# Patient Record
Sex: Female | Born: 1995 | Race: White | Hispanic: No | Marital: Single | Smoking: Never smoker
Health system: Southern US, Community
[De-identification: ages and names within clinical notes are randomized; demographics above are authoritative.]

## PROBLEM LIST (undated history)

## (undated) DIAGNOSIS — J353 Hypertrophy of tonsils with hypertrophy of adenoids: Secondary | ICD-10-CM

## (undated) DIAGNOSIS — G540 Brachial plexus disorders: Secondary | ICD-10-CM

## (undated) DIAGNOSIS — Z9889 Other specified postprocedural states: Secondary | ICD-10-CM

## (undated) DIAGNOSIS — D649 Anemia, unspecified: Secondary | ICD-10-CM

## (undated) DIAGNOSIS — R112 Nausea with vomiting, unspecified: Secondary | ICD-10-CM

## (undated) HISTORY — PX: RIB RESECTION: SHX5077

## (undated) HISTORY — PX: WISDOM TOOTH EXTRACTION: SHX21

## (undated) HISTORY — DX: Anemia, unspecified: D64.9

---
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
## 2017-01-07 DIAGNOSIS — M542 Cervicalgia: Secondary | ICD-10-CM | POA: Diagnosis not present

## 2017-01-07 DIAGNOSIS — M5414 Radiculopathy, thoracic region: Secondary | ICD-10-CM | POA: Diagnosis not present

## 2017-01-07 DIAGNOSIS — M546 Pain in thoracic spine: Secondary | ICD-10-CM | POA: Diagnosis not present

## 2017-01-14 DIAGNOSIS — M9903 Segmental and somatic dysfunction of lumbar region: Secondary | ICD-10-CM | POA: Diagnosis not present

## 2017-01-14 DIAGNOSIS — M9902 Segmental and somatic dysfunction of thoracic region: Secondary | ICD-10-CM | POA: Diagnosis not present

## 2017-01-14 DIAGNOSIS — G44209 Tension-type headache, unspecified, not intractable: Secondary | ICD-10-CM | POA: Diagnosis not present

## 2017-01-14 DIAGNOSIS — F4322 Adjustment disorder with anxiety: Secondary | ICD-10-CM | POA: Diagnosis not present

## 2017-01-14 DIAGNOSIS — M9901 Segmental and somatic dysfunction of cervical region: Secondary | ICD-10-CM | POA: Diagnosis not present

## 2017-01-19 DIAGNOSIS — M542 Cervicalgia: Secondary | ICD-10-CM | POA: Diagnosis not present

## 2017-01-19 DIAGNOSIS — M546 Pain in thoracic spine: Secondary | ICD-10-CM | POA: Diagnosis not present

## 2017-01-19 DIAGNOSIS — M5414 Radiculopathy, thoracic region: Secondary | ICD-10-CM | POA: Diagnosis not present

## 2017-01-28 DIAGNOSIS — M546 Pain in thoracic spine: Secondary | ICD-10-CM | POA: Diagnosis not present

## 2017-01-28 DIAGNOSIS — M5414 Radiculopathy, thoracic region: Secondary | ICD-10-CM | POA: Diagnosis not present

## 2017-01-28 DIAGNOSIS — M542 Cervicalgia: Secondary | ICD-10-CM | POA: Diagnosis not present

## 2017-02-18 DIAGNOSIS — Z6824 Body mass index (BMI) 24.0-24.9, adult: Secondary | ICD-10-CM | POA: Diagnosis not present

## 2017-02-18 DIAGNOSIS — Z01419 Encounter for gynecological examination (general) (routine) without abnormal findings: Secondary | ICD-10-CM | POA: Diagnosis not present

## 2017-02-18 DIAGNOSIS — Z113 Encounter for screening for infections with a predominantly sexual mode of transmission: Secondary | ICD-10-CM | POA: Diagnosis not present

## 2017-02-25 DIAGNOSIS — F4322 Adjustment disorder with anxiety: Secondary | ICD-10-CM | POA: Diagnosis not present

## 2017-03-26 DIAGNOSIS — M542 Cervicalgia: Secondary | ICD-10-CM | POA: Diagnosis not present

## 2017-03-26 DIAGNOSIS — M5414 Radiculopathy, thoracic region: Secondary | ICD-10-CM | POA: Diagnosis not present

## 2017-03-26 DIAGNOSIS — M546 Pain in thoracic spine: Secondary | ICD-10-CM | POA: Diagnosis not present

## 2017-04-09 DIAGNOSIS — M5414 Radiculopathy, thoracic region: Secondary | ICD-10-CM | POA: Diagnosis not present

## 2017-04-09 DIAGNOSIS — M542 Cervicalgia: Secondary | ICD-10-CM | POA: Diagnosis not present

## 2017-04-09 DIAGNOSIS — M546 Pain in thoracic spine: Secondary | ICD-10-CM | POA: Diagnosis not present

## 2017-04-22 DIAGNOSIS — M5414 Radiculopathy, thoracic region: Secondary | ICD-10-CM | POA: Diagnosis not present

## 2017-04-22 DIAGNOSIS — M542 Cervicalgia: Secondary | ICD-10-CM | POA: Diagnosis not present

## 2017-04-22 DIAGNOSIS — M546 Pain in thoracic spine: Secondary | ICD-10-CM | POA: Diagnosis not present

## 2017-04-27 DIAGNOSIS — J019 Acute sinusitis, unspecified: Secondary | ICD-10-CM | POA: Diagnosis not present

## 2017-04-27 DIAGNOSIS — J209 Acute bronchitis, unspecified: Secondary | ICD-10-CM | POA: Diagnosis not present

## 2017-04-28 DIAGNOSIS — F4322 Adjustment disorder with anxiety: Secondary | ICD-10-CM | POA: Diagnosis not present

## 2017-05-06 DIAGNOSIS — M546 Pain in thoracic spine: Secondary | ICD-10-CM | POA: Diagnosis not present

## 2017-05-06 DIAGNOSIS — M542 Cervicalgia: Secondary | ICD-10-CM | POA: Diagnosis not present

## 2017-05-06 DIAGNOSIS — M5414 Radiculopathy, thoracic region: Secondary | ICD-10-CM | POA: Diagnosis not present

## 2017-05-18 DIAGNOSIS — M5414 Radiculopathy, thoracic region: Secondary | ICD-10-CM | POA: Diagnosis not present

## 2017-05-18 DIAGNOSIS — M542 Cervicalgia: Secondary | ICD-10-CM | POA: Diagnosis not present

## 2017-05-18 DIAGNOSIS — M546 Pain in thoracic spine: Secondary | ICD-10-CM | POA: Diagnosis not present

## 2017-06-01 DIAGNOSIS — M546 Pain in thoracic spine: Secondary | ICD-10-CM | POA: Diagnosis not present

## 2017-06-01 DIAGNOSIS — M542 Cervicalgia: Secondary | ICD-10-CM | POA: Diagnosis not present

## 2017-06-01 DIAGNOSIS — M5414 Radiculopathy, thoracic region: Secondary | ICD-10-CM | POA: Diagnosis not present

## 2017-06-17 DIAGNOSIS — M542 Cervicalgia: Secondary | ICD-10-CM | POA: Diagnosis not present

## 2017-06-17 DIAGNOSIS — M546 Pain in thoracic spine: Secondary | ICD-10-CM | POA: Diagnosis not present

## 2017-06-17 DIAGNOSIS — M5414 Radiculopathy, thoracic region: Secondary | ICD-10-CM | POA: Diagnosis not present

## 2017-06-30 DIAGNOSIS — J0100 Acute maxillary sinusitis, unspecified: Secondary | ICD-10-CM | POA: Diagnosis not present

## 2017-07-16 DIAGNOSIS — F4322 Adjustment disorder with anxiety: Secondary | ICD-10-CM | POA: Diagnosis not present

## 2017-07-29 DIAGNOSIS — J019 Acute sinusitis, unspecified: Secondary | ICD-10-CM | POA: Diagnosis not present

## 2017-08-13 DIAGNOSIS — F4322 Adjustment disorder with anxiety: Secondary | ICD-10-CM | POA: Diagnosis not present

## 2017-08-18 DIAGNOSIS — F4322 Adjustment disorder with anxiety: Secondary | ICD-10-CM | POA: Diagnosis not present

## 2017-08-24 DIAGNOSIS — M9901 Segmental and somatic dysfunction of cervical region: Secondary | ICD-10-CM | POA: Diagnosis not present

## 2017-08-24 DIAGNOSIS — M9903 Segmental and somatic dysfunction of lumbar region: Secondary | ICD-10-CM | POA: Diagnosis not present

## 2017-08-25 DIAGNOSIS — F4322 Adjustment disorder with anxiety: Secondary | ICD-10-CM | POA: Diagnosis not present

## 2017-09-02 DIAGNOSIS — M9901 Segmental and somatic dysfunction of cervical region: Secondary | ICD-10-CM | POA: Diagnosis not present

## 2017-09-08 DIAGNOSIS — J302 Other seasonal allergic rhinitis: Secondary | ICD-10-CM | POA: Diagnosis not present

## 2017-09-08 DIAGNOSIS — J329 Chronic sinusitis, unspecified: Secondary | ICD-10-CM | POA: Diagnosis not present

## 2017-09-08 DIAGNOSIS — E559 Vitamin D deficiency, unspecified: Secondary | ICD-10-CM | POA: Diagnosis not present

## 2017-09-08 DIAGNOSIS — K582 Mixed irritable bowel syndrome: Secondary | ICD-10-CM | POA: Diagnosis not present

## 2017-09-14 DIAGNOSIS — M9901 Segmental and somatic dysfunction of cervical region: Secondary | ICD-10-CM | POA: Diagnosis not present

## 2017-09-14 DIAGNOSIS — M9905 Segmental and somatic dysfunction of pelvic region: Secondary | ICD-10-CM | POA: Diagnosis not present

## 2017-09-14 DIAGNOSIS — M9902 Segmental and somatic dysfunction of thoracic region: Secondary | ICD-10-CM | POA: Diagnosis not present

## 2017-09-14 DIAGNOSIS — M9903 Segmental and somatic dysfunction of lumbar region: Secondary | ICD-10-CM | POA: Diagnosis not present

## 2017-09-16 DIAGNOSIS — F4322 Adjustment disorder with anxiety: Secondary | ICD-10-CM | POA: Diagnosis not present

## 2017-09-22 DIAGNOSIS — F4322 Adjustment disorder with anxiety: Secondary | ICD-10-CM | POA: Diagnosis not present

## 2017-09-24 DIAGNOSIS — M9903 Segmental and somatic dysfunction of lumbar region: Secondary | ICD-10-CM | POA: Diagnosis not present

## 2017-09-24 DIAGNOSIS — M9901 Segmental and somatic dysfunction of cervical region: Secondary | ICD-10-CM | POA: Diagnosis not present

## 2017-09-24 DIAGNOSIS — M9902 Segmental and somatic dysfunction of thoracic region: Secondary | ICD-10-CM | POA: Diagnosis not present

## 2017-09-28 DIAGNOSIS — J329 Chronic sinusitis, unspecified: Secondary | ICD-10-CM | POA: Diagnosis not present

## 2017-09-28 DIAGNOSIS — E559 Vitamin D deficiency, unspecified: Secondary | ICD-10-CM | POA: Diagnosis not present

## 2017-09-28 DIAGNOSIS — J302 Other seasonal allergic rhinitis: Secondary | ICD-10-CM | POA: Diagnosis not present

## 2017-09-28 DIAGNOSIS — K582 Mixed irritable bowel syndrome: Secondary | ICD-10-CM | POA: Diagnosis not present

## 2017-09-29 DIAGNOSIS — F4322 Adjustment disorder with anxiety: Secondary | ICD-10-CM | POA: Diagnosis not present

## 2017-09-30 DIAGNOSIS — M9903 Segmental and somatic dysfunction of lumbar region: Secondary | ICD-10-CM | POA: Diagnosis not present

## 2017-09-30 DIAGNOSIS — M9901 Segmental and somatic dysfunction of cervical region: Secondary | ICD-10-CM | POA: Diagnosis not present

## 2017-09-30 DIAGNOSIS — M9902 Segmental and somatic dysfunction of thoracic region: Secondary | ICD-10-CM | POA: Diagnosis not present

## 2017-10-05 DIAGNOSIS — Z1331 Encounter for screening for depression: Secondary | ICD-10-CM | POA: Diagnosis not present

## 2017-10-05 DIAGNOSIS — Z0000 Encounter for general adult medical examination without abnormal findings: Secondary | ICD-10-CM | POA: Diagnosis not present

## 2017-10-05 DIAGNOSIS — Z201 Contact with and (suspected) exposure to tuberculosis: Secondary | ICD-10-CM | POA: Diagnosis not present

## 2017-10-05 DIAGNOSIS — Z13220 Encounter for screening for lipoid disorders: Secondary | ICD-10-CM | POA: Diagnosis not present

## 2017-10-06 ENCOUNTER — Other Ambulatory Visit: Payer: Self-pay | Admitting: Pediatrics

## 2017-10-06 ENCOUNTER — Ambulatory Visit
Admission: RE | Admit: 2017-10-06 | Discharge: 2017-10-06 | Disposition: A | Discharge status: Home / Self Care | Payer: Self-pay | Source: Ambulatory Visit | Attending: Pediatrics | Admitting: Pediatrics

## 2017-10-06 DIAGNOSIS — R7611 Nonspecific reaction to tuberculin skin test without active tuberculosis: Secondary | ICD-10-CM | POA: Diagnosis not present

## 2017-10-06 DIAGNOSIS — Z201 Contact with and (suspected) exposure to tuberculosis: Secondary | ICD-10-CM

## 2017-10-08 DIAGNOSIS — M9903 Segmental and somatic dysfunction of lumbar region: Secondary | ICD-10-CM | POA: Diagnosis not present

## 2017-10-08 DIAGNOSIS — M9901 Segmental and somatic dysfunction of cervical region: Secondary | ICD-10-CM | POA: Diagnosis not present

## 2017-10-20 DIAGNOSIS — M5414 Radiculopathy, thoracic region: Secondary | ICD-10-CM | POA: Diagnosis not present

## 2017-10-20 DIAGNOSIS — M546 Pain in thoracic spine: Secondary | ICD-10-CM | POA: Diagnosis not present

## 2017-10-20 DIAGNOSIS — M542 Cervicalgia: Secondary | ICD-10-CM | POA: Diagnosis not present

## 2017-10-28 DIAGNOSIS — M5414 Radiculopathy, thoracic region: Secondary | ICD-10-CM | POA: Diagnosis not present

## 2017-10-28 DIAGNOSIS — M542 Cervicalgia: Secondary | ICD-10-CM | POA: Diagnosis not present

## 2017-10-28 DIAGNOSIS — M546 Pain in thoracic spine: Secondary | ICD-10-CM | POA: Diagnosis not present

## 2017-11-11 DIAGNOSIS — M542 Cervicalgia: Secondary | ICD-10-CM | POA: Diagnosis not present

## 2017-11-11 DIAGNOSIS — M546 Pain in thoracic spine: Secondary | ICD-10-CM | POA: Diagnosis not present

## 2017-11-11 DIAGNOSIS — M5414 Radiculopathy, thoracic region: Secondary | ICD-10-CM | POA: Diagnosis not present

## 2017-11-25 DIAGNOSIS — M542 Cervicalgia: Secondary | ICD-10-CM | POA: Diagnosis not present

## 2017-11-25 DIAGNOSIS — M5414 Radiculopathy, thoracic region: Secondary | ICD-10-CM | POA: Diagnosis not present

## 2017-11-25 DIAGNOSIS — M546 Pain in thoracic spine: Secondary | ICD-10-CM | POA: Diagnosis not present

## 2017-12-16 DIAGNOSIS — M5414 Radiculopathy, thoracic region: Secondary | ICD-10-CM | POA: Diagnosis not present

## 2017-12-16 DIAGNOSIS — M542 Cervicalgia: Secondary | ICD-10-CM | POA: Diagnosis not present

## 2017-12-16 DIAGNOSIS — M546 Pain in thoracic spine: Secondary | ICD-10-CM | POA: Diagnosis not present

## 2017-12-23 DIAGNOSIS — M546 Pain in thoracic spine: Secondary | ICD-10-CM | POA: Diagnosis not present

## 2017-12-23 DIAGNOSIS — M542 Cervicalgia: Secondary | ICD-10-CM | POA: Diagnosis not present

## 2017-12-23 DIAGNOSIS — M5414 Radiculopathy, thoracic region: Secondary | ICD-10-CM | POA: Diagnosis not present

## 2017-12-30 DIAGNOSIS — M546 Pain in thoracic spine: Secondary | ICD-10-CM | POA: Diagnosis not present

## 2017-12-30 DIAGNOSIS — M542 Cervicalgia: Secondary | ICD-10-CM | POA: Diagnosis not present

## 2017-12-30 DIAGNOSIS — M5414 Radiculopathy, thoracic region: Secondary | ICD-10-CM | POA: Diagnosis not present

## 2018-01-06 DIAGNOSIS — J019 Acute sinusitis, unspecified: Secondary | ICD-10-CM | POA: Diagnosis not present

## 2018-01-13 DIAGNOSIS — M546 Pain in thoracic spine: Secondary | ICD-10-CM | POA: Diagnosis not present

## 2018-01-13 DIAGNOSIS — M542 Cervicalgia: Secondary | ICD-10-CM | POA: Diagnosis not present

## 2018-01-13 DIAGNOSIS — M5414 Radiculopathy, thoracic region: Secondary | ICD-10-CM | POA: Diagnosis not present

## 2018-01-20 DIAGNOSIS — Z3009 Encounter for other general counseling and advice on contraception: Secondary | ICD-10-CM | POA: Diagnosis not present

## 2018-01-27 DIAGNOSIS — M5414 Radiculopathy, thoracic region: Secondary | ICD-10-CM | POA: Diagnosis not present

## 2018-01-27 DIAGNOSIS — M546 Pain in thoracic spine: Secondary | ICD-10-CM | POA: Diagnosis not present

## 2018-01-27 DIAGNOSIS — M542 Cervicalgia: Secondary | ICD-10-CM | POA: Diagnosis not present

## 2018-01-28 DIAGNOSIS — E559 Vitamin D deficiency, unspecified: Secondary | ICD-10-CM | POA: Diagnosis not present

## 2018-01-28 DIAGNOSIS — J329 Chronic sinusitis, unspecified: Secondary | ICD-10-CM | POA: Diagnosis not present

## 2018-01-28 DIAGNOSIS — J302 Other seasonal allergic rhinitis: Secondary | ICD-10-CM | POA: Diagnosis not present

## 2018-01-28 DIAGNOSIS — K582 Mixed irritable bowel syndrome: Secondary | ICD-10-CM | POA: Diagnosis not present

## 2018-02-03 DIAGNOSIS — J343 Hypertrophy of nasal turbinates: Secondary | ICD-10-CM | POA: Diagnosis not present

## 2018-02-03 DIAGNOSIS — J342 Deviated nasal septum: Secondary | ICD-10-CM | POA: Diagnosis not present

## 2018-02-03 DIAGNOSIS — J324 Chronic pansinusitis: Secondary | ICD-10-CM | POA: Diagnosis not present

## 2018-02-12 DIAGNOSIS — J329 Chronic sinusitis, unspecified: Secondary | ICD-10-CM | POA: Diagnosis not present

## 2018-02-12 DIAGNOSIS — K582 Mixed irritable bowel syndrome: Secondary | ICD-10-CM | POA: Diagnosis not present

## 2018-02-12 DIAGNOSIS — J302 Other seasonal allergic rhinitis: Secondary | ICD-10-CM | POA: Diagnosis not present

## 2018-02-12 DIAGNOSIS — E559 Vitamin D deficiency, unspecified: Secondary | ICD-10-CM | POA: Diagnosis not present

## 2018-02-18 DIAGNOSIS — M9903 Segmental and somatic dysfunction of lumbar region: Secondary | ICD-10-CM | POA: Diagnosis not present

## 2018-02-18 DIAGNOSIS — M9901 Segmental and somatic dysfunction of cervical region: Secondary | ICD-10-CM | POA: Diagnosis not present

## 2018-02-18 DIAGNOSIS — M9905 Segmental and somatic dysfunction of pelvic region: Secondary | ICD-10-CM | POA: Diagnosis not present

## 2018-02-18 DIAGNOSIS — M9902 Segmental and somatic dysfunction of thoracic region: Secondary | ICD-10-CM | POA: Diagnosis not present

## 2018-02-22 DIAGNOSIS — Z01419 Encounter for gynecological examination (general) (routine) without abnormal findings: Secondary | ICD-10-CM | POA: Diagnosis not present

## 2018-02-22 DIAGNOSIS — Z6824 Body mass index (BMI) 24.0-24.9, adult: Secondary | ICD-10-CM | POA: Diagnosis not present

## 2018-03-01 DIAGNOSIS — M9905 Segmental and somatic dysfunction of pelvic region: Secondary | ICD-10-CM | POA: Diagnosis not present

## 2018-03-01 DIAGNOSIS — M9903 Segmental and somatic dysfunction of lumbar region: Secondary | ICD-10-CM | POA: Diagnosis not present

## 2018-03-01 DIAGNOSIS — M9902 Segmental and somatic dysfunction of thoracic region: Secondary | ICD-10-CM | POA: Diagnosis not present

## 2018-03-01 DIAGNOSIS — M9901 Segmental and somatic dysfunction of cervical region: Secondary | ICD-10-CM | POA: Diagnosis not present

## 2018-03-02 DIAGNOSIS — J342 Deviated nasal septum: Secondary | ICD-10-CM | POA: Diagnosis not present

## 2018-03-02 DIAGNOSIS — J343 Hypertrophy of nasal turbinates: Secondary | ICD-10-CM | POA: Diagnosis not present

## 2018-03-02 DIAGNOSIS — J329 Chronic sinusitis, unspecified: Secondary | ICD-10-CM | POA: Diagnosis not present

## 2018-03-02 DIAGNOSIS — J341 Cyst and mucocele of nose and nasal sinus: Secondary | ICD-10-CM | POA: Diagnosis not present

## 2018-03-02 DIAGNOSIS — J324 Chronic pansinusitis: Secondary | ICD-10-CM | POA: Diagnosis not present

## 2018-03-02 DIAGNOSIS — J328 Other chronic sinusitis: Secondary | ICD-10-CM | POA: Diagnosis not present

## 2018-03-05 DIAGNOSIS — M9905 Segmental and somatic dysfunction of pelvic region: Secondary | ICD-10-CM | POA: Diagnosis not present

## 2018-03-05 DIAGNOSIS — M9901 Segmental and somatic dysfunction of cervical region: Secondary | ICD-10-CM | POA: Diagnosis not present

## 2018-03-05 DIAGNOSIS — M9903 Segmental and somatic dysfunction of lumbar region: Secondary | ICD-10-CM | POA: Diagnosis not present

## 2018-03-05 DIAGNOSIS — M9902 Segmental and somatic dysfunction of thoracic region: Secondary | ICD-10-CM | POA: Diagnosis not present

## 2018-04-22 DIAGNOSIS — F4322 Adjustment disorder with anxiety: Secondary | ICD-10-CM | POA: Diagnosis not present

## 2018-05-06 DIAGNOSIS — F4322 Adjustment disorder with anxiety: Secondary | ICD-10-CM | POA: Diagnosis not present

## 2018-05-25 DIAGNOSIS — F4322 Adjustment disorder with anxiety: Secondary | ICD-10-CM | POA: Diagnosis not present

## 2018-05-27 DIAGNOSIS — M9903 Segmental and somatic dysfunction of lumbar region: Secondary | ICD-10-CM | POA: Diagnosis not present

## 2018-05-27 DIAGNOSIS — M9905 Segmental and somatic dysfunction of pelvic region: Secondary | ICD-10-CM | POA: Diagnosis not present

## 2018-05-27 DIAGNOSIS — M9901 Segmental and somatic dysfunction of cervical region: Secondary | ICD-10-CM | POA: Diagnosis not present

## 2018-05-27 DIAGNOSIS — M9902 Segmental and somatic dysfunction of thoracic region: Secondary | ICD-10-CM | POA: Diagnosis not present

## 2018-06-07 DIAGNOSIS — M9903 Segmental and somatic dysfunction of lumbar region: Secondary | ICD-10-CM | POA: Diagnosis not present

## 2018-06-07 DIAGNOSIS — M9905 Segmental and somatic dysfunction of pelvic region: Secondary | ICD-10-CM | POA: Diagnosis not present

## 2018-06-07 DIAGNOSIS — M9902 Segmental and somatic dysfunction of thoracic region: Secondary | ICD-10-CM | POA: Diagnosis not present

## 2018-06-07 DIAGNOSIS — M9901 Segmental and somatic dysfunction of cervical region: Secondary | ICD-10-CM | POA: Diagnosis not present

## 2018-07-08 DIAGNOSIS — M9905 Segmental and somatic dysfunction of pelvic region: Secondary | ICD-10-CM | POA: Diagnosis not present

## 2018-07-08 DIAGNOSIS — M9902 Segmental and somatic dysfunction of thoracic region: Secondary | ICD-10-CM | POA: Diagnosis not present

## 2018-07-08 DIAGNOSIS — M9901 Segmental and somatic dysfunction of cervical region: Secondary | ICD-10-CM | POA: Diagnosis not present

## 2018-07-08 DIAGNOSIS — M9903 Segmental and somatic dysfunction of lumbar region: Secondary | ICD-10-CM | POA: Diagnosis not present

## 2018-07-14 DIAGNOSIS — M9905 Segmental and somatic dysfunction of pelvic region: Secondary | ICD-10-CM | POA: Diagnosis not present

## 2018-07-14 DIAGNOSIS — M9901 Segmental and somatic dysfunction of cervical region: Secondary | ICD-10-CM | POA: Diagnosis not present

## 2018-07-14 DIAGNOSIS — M9903 Segmental and somatic dysfunction of lumbar region: Secondary | ICD-10-CM | POA: Diagnosis not present

## 2018-07-14 DIAGNOSIS — M9902 Segmental and somatic dysfunction of thoracic region: Secondary | ICD-10-CM | POA: Diagnosis not present

## 2018-07-21 DIAGNOSIS — M9903 Segmental and somatic dysfunction of lumbar region: Secondary | ICD-10-CM | POA: Diagnosis not present

## 2018-07-21 DIAGNOSIS — M9901 Segmental and somatic dysfunction of cervical region: Secondary | ICD-10-CM | POA: Diagnosis not present

## 2018-07-21 DIAGNOSIS — M9902 Segmental and somatic dysfunction of thoracic region: Secondary | ICD-10-CM | POA: Diagnosis not present

## 2018-07-21 DIAGNOSIS — M9905 Segmental and somatic dysfunction of pelvic region: Secondary | ICD-10-CM | POA: Diagnosis not present

## 2018-09-02 DIAGNOSIS — M9902 Segmental and somatic dysfunction of thoracic region: Secondary | ICD-10-CM | POA: Diagnosis not present

## 2018-09-02 DIAGNOSIS — M9903 Segmental and somatic dysfunction of lumbar region: Secondary | ICD-10-CM | POA: Diagnosis not present

## 2018-09-02 DIAGNOSIS — M9905 Segmental and somatic dysfunction of pelvic region: Secondary | ICD-10-CM | POA: Diagnosis not present

## 2018-09-02 DIAGNOSIS — M9901 Segmental and somatic dysfunction of cervical region: Secondary | ICD-10-CM | POA: Diagnosis not present

## 2018-09-27 DIAGNOSIS — F4322 Adjustment disorder with anxiety: Secondary | ICD-10-CM | POA: Diagnosis not present

## 2018-10-15 DIAGNOSIS — M546 Pain in thoracic spine: Secondary | ICD-10-CM | POA: Diagnosis not present

## 2018-10-15 DIAGNOSIS — B354 Tinea corporis: Secondary | ICD-10-CM | POA: Diagnosis not present

## 2018-10-15 DIAGNOSIS — R21 Rash and other nonspecific skin eruption: Secondary | ICD-10-CM | POA: Diagnosis not present

## 2018-10-15 DIAGNOSIS — M542 Cervicalgia: Secondary | ICD-10-CM | POA: Diagnosis not present

## 2018-10-15 DIAGNOSIS — L298 Other pruritus: Secondary | ICD-10-CM | POA: Diagnosis not present

## 2018-10-15 DIAGNOSIS — M5414 Radiculopathy, thoracic region: Secondary | ICD-10-CM | POA: Diagnosis not present

## 2018-10-29 DIAGNOSIS — M546 Pain in thoracic spine: Secondary | ICD-10-CM | POA: Diagnosis not present

## 2018-10-29 DIAGNOSIS — M5414 Radiculopathy, thoracic region: Secondary | ICD-10-CM | POA: Diagnosis not present

## 2018-10-29 DIAGNOSIS — M542 Cervicalgia: Secondary | ICD-10-CM | POA: Diagnosis not present

## 2018-11-04 DIAGNOSIS — R109 Unspecified abdominal pain: Secondary | ICD-10-CM | POA: Diagnosis not present

## 2018-11-05 DIAGNOSIS — R109 Unspecified abdominal pain: Secondary | ICD-10-CM | POA: Diagnosis not present

## 2018-11-06 DIAGNOSIS — M546 Pain in thoracic spine: Secondary | ICD-10-CM | POA: Diagnosis not present

## 2018-11-06 DIAGNOSIS — M542 Cervicalgia: Secondary | ICD-10-CM | POA: Diagnosis not present

## 2018-11-06 DIAGNOSIS — M5414 Radiculopathy, thoracic region: Secondary | ICD-10-CM | POA: Diagnosis not present

## 2018-11-12 DIAGNOSIS — M542 Cervicalgia: Secondary | ICD-10-CM | POA: Diagnosis not present

## 2018-11-12 DIAGNOSIS — M546 Pain in thoracic spine: Secondary | ICD-10-CM | POA: Diagnosis not present

## 2018-11-12 DIAGNOSIS — M5414 Radiculopathy, thoracic region: Secondary | ICD-10-CM | POA: Diagnosis not present

## 2018-11-14 IMAGING — CR DG CHEST 2V
2 series · 2 of 2 positions shown · non-contrast
Comparison: No prior.

CLINICAL DATA: Positive PPD.

EXAM:
CHEST - 2 VIEW

[w chest pa 8-[id] (15-22cm)]
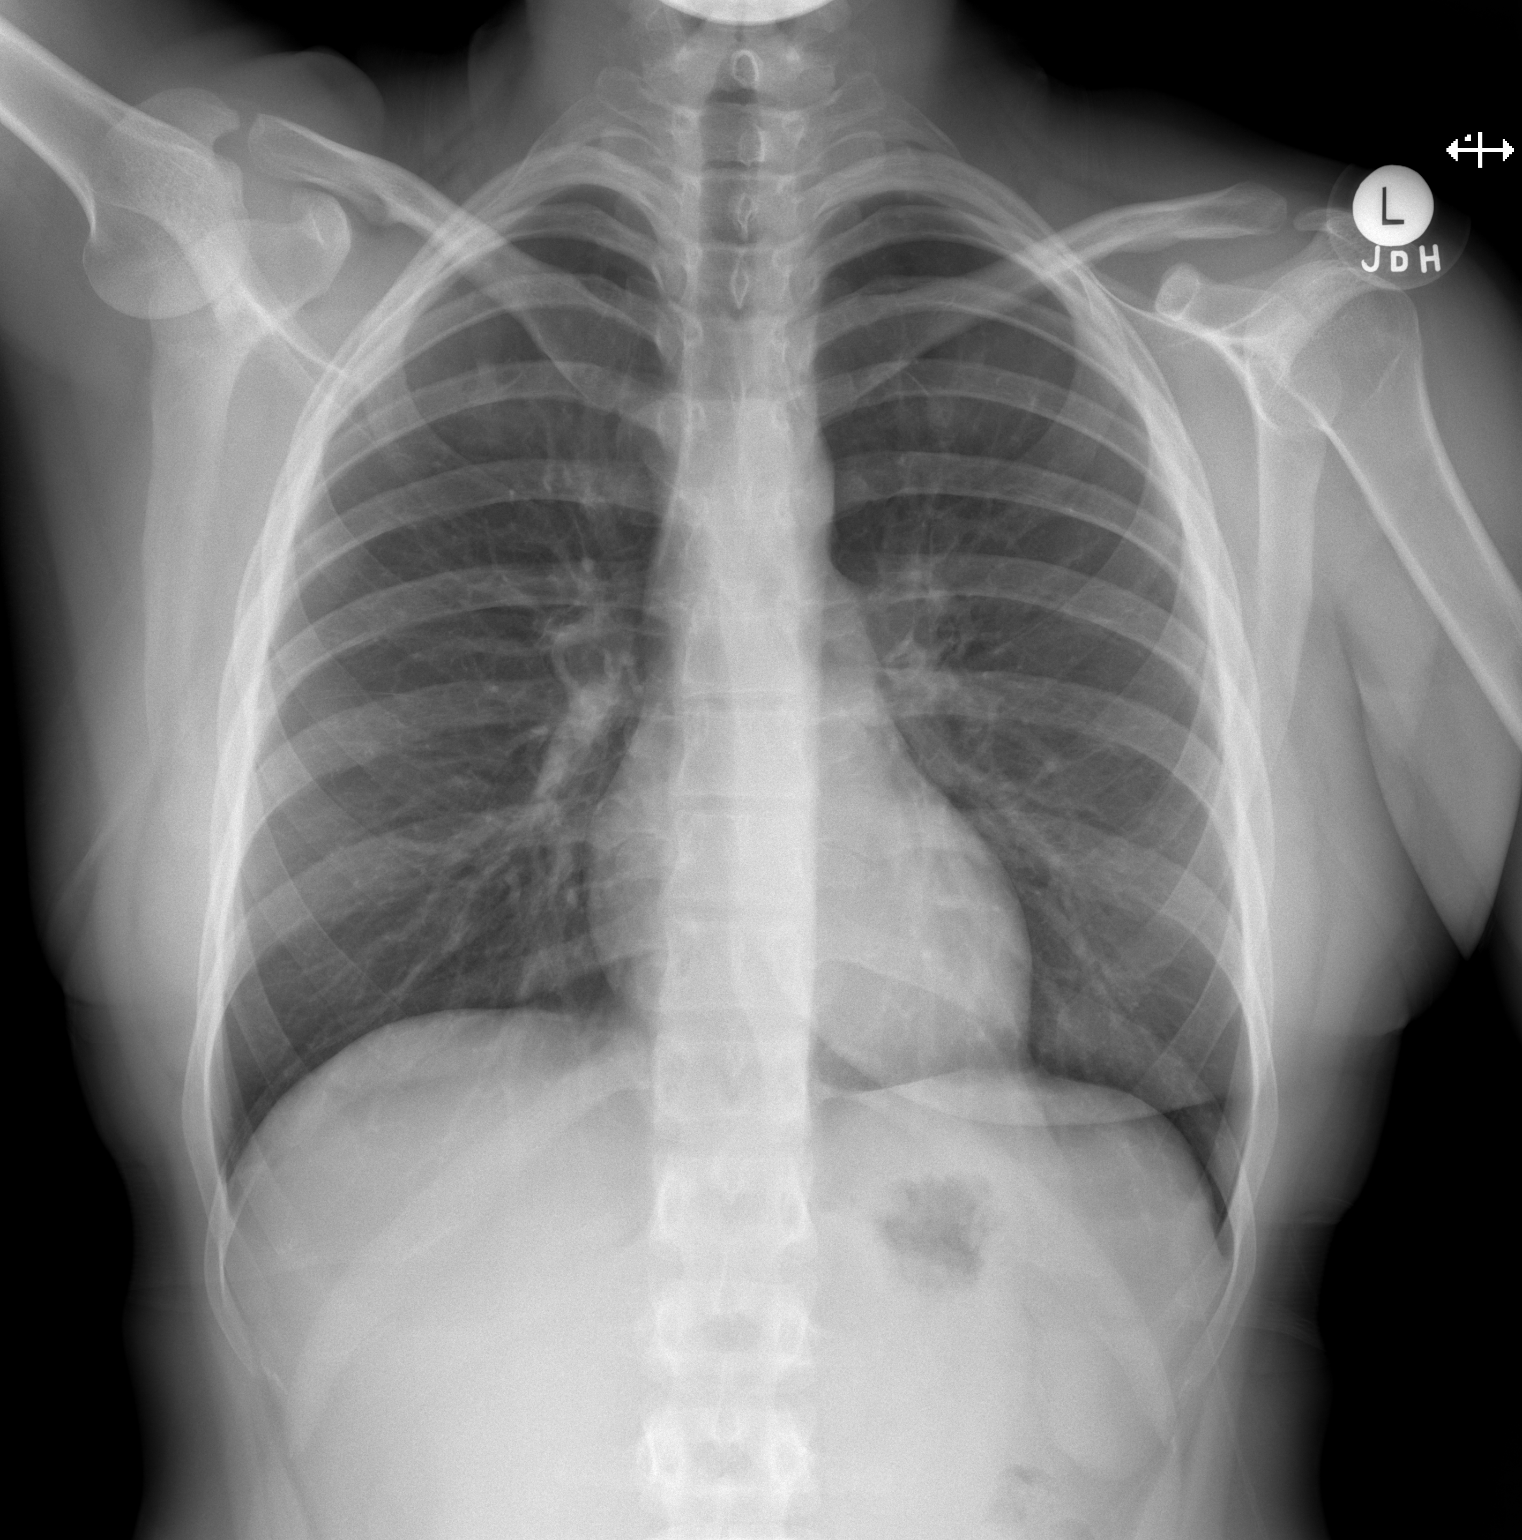

[w chest lat 8-[id] (21-28cm)]
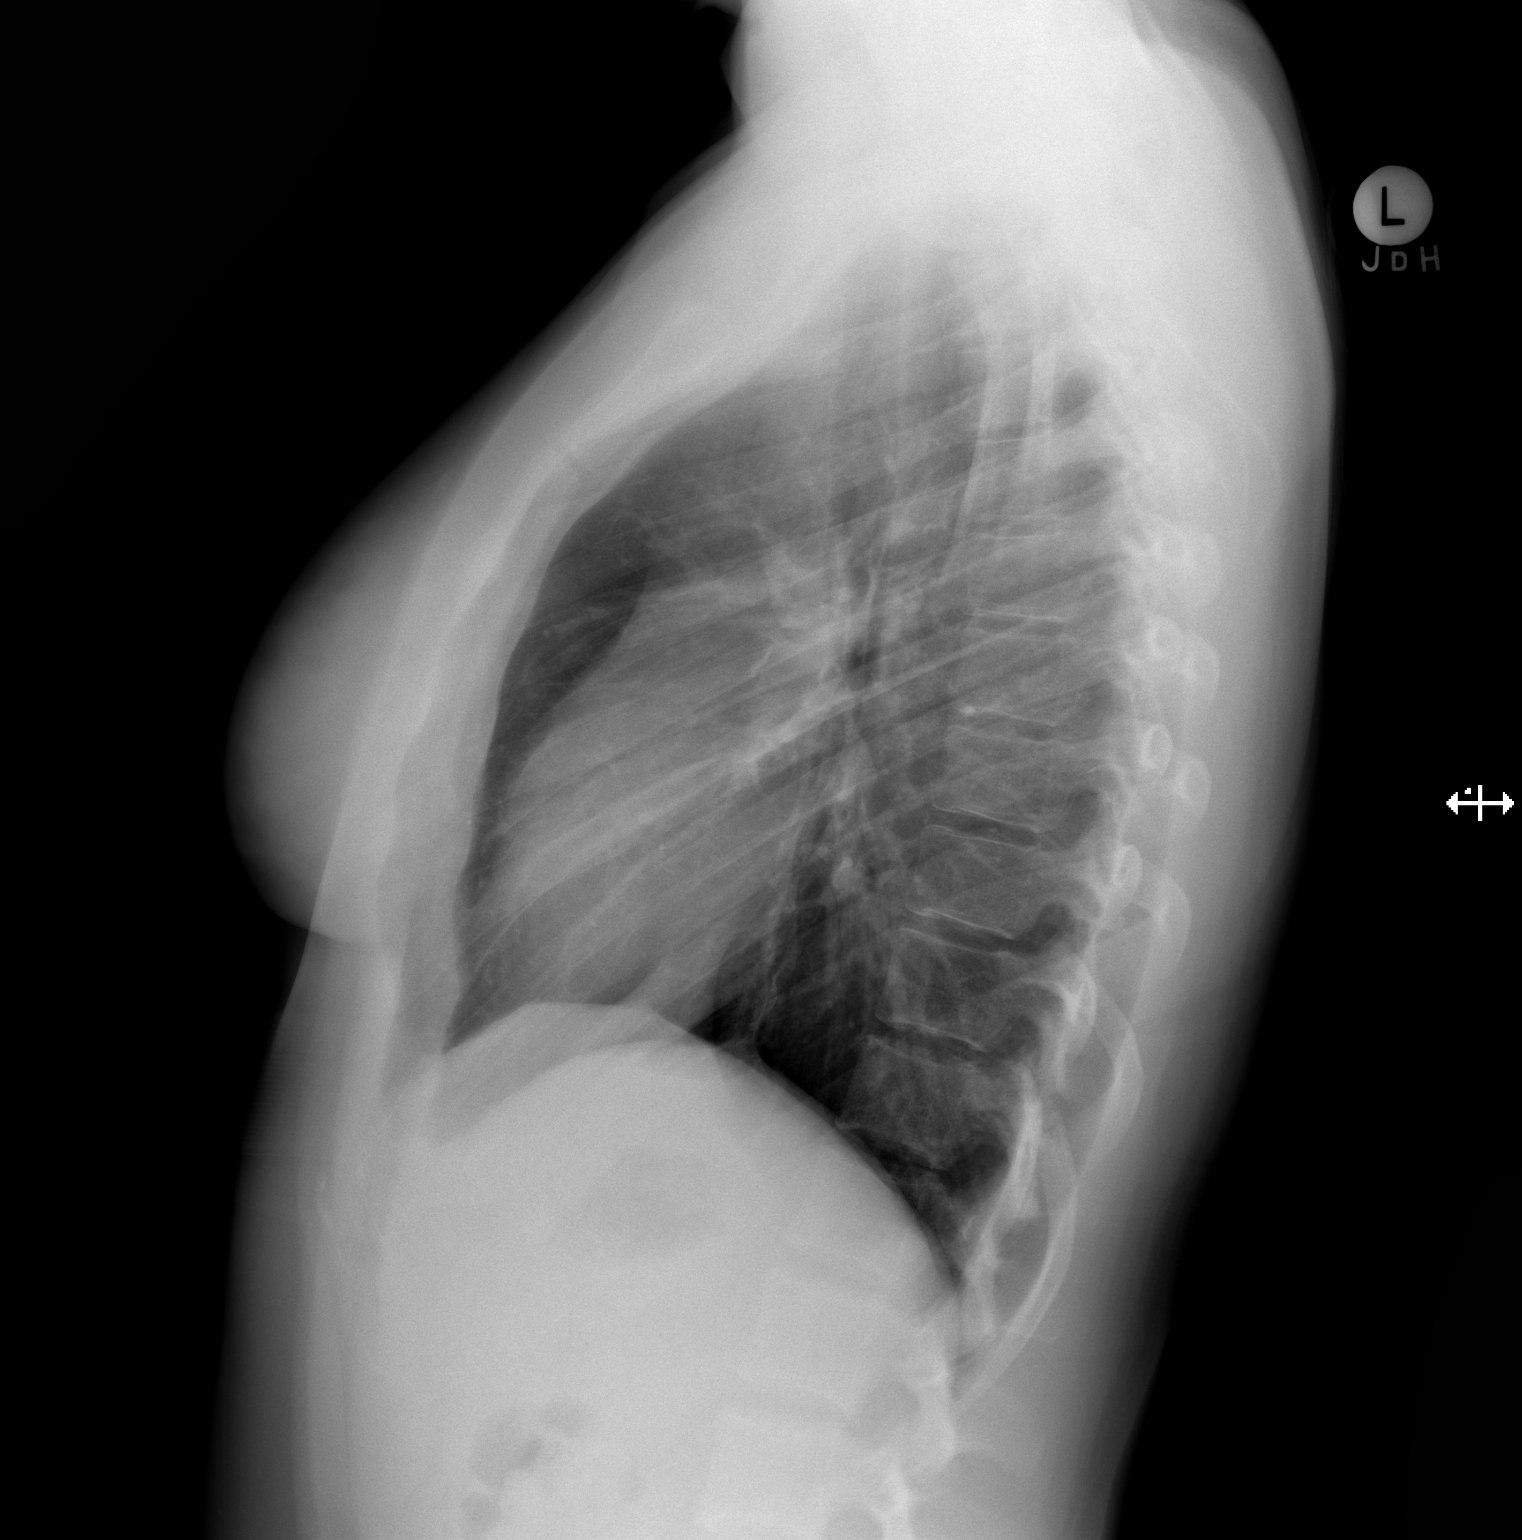

[2 of 2 positions shown; findings below may reference images not displayed]

FINDINGS: Mediastinum and hilar structures normal. Lungs are clear. No pleural
effusion or pneumothorax. Heart size normal. No acute bony
abnormality.
IMPRESSION: No acute cardiopulmonary disease.

## 2018-11-20 DIAGNOSIS — M542 Cervicalgia: Secondary | ICD-10-CM | POA: Diagnosis not present

## 2018-11-20 DIAGNOSIS — M5414 Radiculopathy, thoracic region: Secondary | ICD-10-CM | POA: Diagnosis not present

## 2018-11-20 DIAGNOSIS — M546 Pain in thoracic spine: Secondary | ICD-10-CM | POA: Diagnosis not present

## 2018-11-24 DIAGNOSIS — F4322 Adjustment disorder with anxiety: Secondary | ICD-10-CM | POA: Diagnosis not present

## 2018-11-25 DIAGNOSIS — E559 Vitamin D deficiency, unspecified: Secondary | ICD-10-CM | POA: Diagnosis not present

## 2018-11-25 DIAGNOSIS — E538 Deficiency of other specified B group vitamins: Secondary | ICD-10-CM | POA: Diagnosis not present

## 2018-11-25 DIAGNOSIS — F329 Major depressive disorder, single episode, unspecified: Secondary | ICD-10-CM | POA: Diagnosis not present

## 2018-11-25 DIAGNOSIS — R635 Abnormal weight gain: Secondary | ICD-10-CM | POA: Diagnosis not present

## 2018-11-25 DIAGNOSIS — K582 Mixed irritable bowel syndrome: Secondary | ICD-10-CM | POA: Diagnosis not present

## 2018-11-25 DIAGNOSIS — R5383 Other fatigue: Secondary | ICD-10-CM | POA: Diagnosis not present

## 2018-11-25 DIAGNOSIS — R109 Unspecified abdominal pain: Secondary | ICD-10-CM | POA: Diagnosis not present

## 2018-11-25 DIAGNOSIS — Z9189 Other specified personal risk factors, not elsewhere classified: Secondary | ICD-10-CM | POA: Diagnosis not present

## 2018-11-30 DIAGNOSIS — Z20828 Contact with and (suspected) exposure to other viral communicable diseases: Secondary | ICD-10-CM | POA: Diagnosis not present

## 2018-12-03 DIAGNOSIS — M546 Pain in thoracic spine: Secondary | ICD-10-CM | POA: Diagnosis not present

## 2018-12-03 DIAGNOSIS — M5414 Radiculopathy, thoracic region: Secondary | ICD-10-CM | POA: Diagnosis not present

## 2018-12-03 DIAGNOSIS — M542 Cervicalgia: Secondary | ICD-10-CM | POA: Diagnosis not present

## 2018-12-09 DIAGNOSIS — Z23 Encounter for immunization: Secondary | ICD-10-CM | POA: Diagnosis not present

## 2018-12-10 DIAGNOSIS — F411 Generalized anxiety disorder: Secondary | ICD-10-CM | POA: Diagnosis not present

## 2018-12-10 DIAGNOSIS — M542 Cervicalgia: Secondary | ICD-10-CM | POA: Diagnosis not present

## 2018-12-10 DIAGNOSIS — F4310 Post-traumatic stress disorder, unspecified: Secondary | ICD-10-CM | POA: Diagnosis not present

## 2018-12-10 DIAGNOSIS — M5414 Radiculopathy, thoracic region: Secondary | ICD-10-CM | POA: Diagnosis not present

## 2018-12-10 DIAGNOSIS — M546 Pain in thoracic spine: Secondary | ICD-10-CM | POA: Diagnosis not present

## 2018-12-14 DIAGNOSIS — F4310 Post-traumatic stress disorder, unspecified: Secondary | ICD-10-CM | POA: Diagnosis not present

## 2018-12-14 DIAGNOSIS — F411 Generalized anxiety disorder: Secondary | ICD-10-CM | POA: Diagnosis not present

## 2018-12-16 DIAGNOSIS — R140 Abdominal distension (gaseous): Secondary | ICD-10-CM | POA: Diagnosis not present

## 2018-12-17 DIAGNOSIS — M546 Pain in thoracic spine: Secondary | ICD-10-CM | POA: Diagnosis not present

## 2018-12-17 DIAGNOSIS — M5414 Radiculopathy, thoracic region: Secondary | ICD-10-CM | POA: Diagnosis not present

## 2018-12-17 DIAGNOSIS — M542 Cervicalgia: Secondary | ICD-10-CM | POA: Diagnosis not present

## 2018-12-21 DIAGNOSIS — Z20828 Contact with and (suspected) exposure to other viral communicable diseases: Secondary | ICD-10-CM | POA: Diagnosis not present

## 2018-12-22 DIAGNOSIS — Z23 Encounter for immunization: Secondary | ICD-10-CM | POA: Diagnosis not present

## 2018-12-23 DIAGNOSIS — F4310 Post-traumatic stress disorder, unspecified: Secondary | ICD-10-CM | POA: Diagnosis not present

## 2018-12-23 DIAGNOSIS — F411 Generalized anxiety disorder: Secondary | ICD-10-CM | POA: Diagnosis not present

## 2018-12-27 DIAGNOSIS — R140 Abdominal distension (gaseous): Secondary | ICD-10-CM | POA: Diagnosis not present

## 2018-12-28 DIAGNOSIS — F411 Generalized anxiety disorder: Secondary | ICD-10-CM | POA: Diagnosis not present

## 2018-12-28 DIAGNOSIS — F4310 Post-traumatic stress disorder, unspecified: Secondary | ICD-10-CM | POA: Diagnosis not present

## 2018-12-30 DIAGNOSIS — F339 Major depressive disorder, recurrent, unspecified: Secondary | ICD-10-CM | POA: Diagnosis not present

## 2019-01-07 DIAGNOSIS — Z20828 Contact with and (suspected) exposure to other viral communicable diseases: Secondary | ICD-10-CM | POA: Diagnosis not present

## 2019-01-07 DIAGNOSIS — F339 Major depressive disorder, recurrent, unspecified: Secondary | ICD-10-CM | POA: Diagnosis not present

## 2019-01-07 DIAGNOSIS — M546 Pain in thoracic spine: Secondary | ICD-10-CM | POA: Diagnosis not present

## 2019-01-07 DIAGNOSIS — M5414 Radiculopathy, thoracic region: Secondary | ICD-10-CM | POA: Diagnosis not present

## 2019-01-07 DIAGNOSIS — M542 Cervicalgia: Secondary | ICD-10-CM | POA: Diagnosis not present

## 2019-01-14 DIAGNOSIS — F411 Generalized anxiety disorder: Secondary | ICD-10-CM | POA: Diagnosis not present

## 2019-01-14 DIAGNOSIS — M5414 Radiculopathy, thoracic region: Secondary | ICD-10-CM | POA: Diagnosis not present

## 2019-01-14 DIAGNOSIS — M546 Pain in thoracic spine: Secondary | ICD-10-CM | POA: Diagnosis not present

## 2019-01-14 DIAGNOSIS — F4310 Post-traumatic stress disorder, unspecified: Secondary | ICD-10-CM | POA: Diagnosis not present

## 2019-01-14 DIAGNOSIS — M542 Cervicalgia: Secondary | ICD-10-CM | POA: Diagnosis not present

## 2019-01-18 DIAGNOSIS — F341 Dysthymic disorder: Secondary | ICD-10-CM | POA: Diagnosis not present

## 2019-01-19 DIAGNOSIS — N760 Acute vaginitis: Secondary | ICD-10-CM | POA: Diagnosis not present

## 2019-01-27 DIAGNOSIS — F341 Dysthymic disorder: Secondary | ICD-10-CM | POA: Diagnosis not present

## 2019-02-03 DIAGNOSIS — M9905 Segmental and somatic dysfunction of pelvic region: Secondary | ICD-10-CM | POA: Diagnosis not present

## 2019-02-03 DIAGNOSIS — M9903 Segmental and somatic dysfunction of lumbar region: Secondary | ICD-10-CM | POA: Diagnosis not present

## 2019-02-03 DIAGNOSIS — M9901 Segmental and somatic dysfunction of cervical region: Secondary | ICD-10-CM | POA: Diagnosis not present

## 2019-02-03 DIAGNOSIS — M9902 Segmental and somatic dysfunction of thoracic region: Secondary | ICD-10-CM | POA: Diagnosis not present

## 2019-02-09 DIAGNOSIS — F341 Dysthymic disorder: Secondary | ICD-10-CM | POA: Diagnosis not present

## 2019-02-10 DIAGNOSIS — M9905 Segmental and somatic dysfunction of pelvic region: Secondary | ICD-10-CM | POA: Diagnosis not present

## 2019-02-10 DIAGNOSIS — M9902 Segmental and somatic dysfunction of thoracic region: Secondary | ICD-10-CM | POA: Diagnosis not present

## 2019-02-10 DIAGNOSIS — M9901 Segmental and somatic dysfunction of cervical region: Secondary | ICD-10-CM | POA: Diagnosis not present

## 2019-02-10 DIAGNOSIS — M9903 Segmental and somatic dysfunction of lumbar region: Secondary | ICD-10-CM | POA: Diagnosis not present

## 2019-02-22 DIAGNOSIS — M9902 Segmental and somatic dysfunction of thoracic region: Secondary | ICD-10-CM | POA: Diagnosis not present

## 2019-02-22 DIAGNOSIS — M9903 Segmental and somatic dysfunction of lumbar region: Secondary | ICD-10-CM | POA: Diagnosis not present

## 2019-02-22 DIAGNOSIS — M9901 Segmental and somatic dysfunction of cervical region: Secondary | ICD-10-CM | POA: Diagnosis not present

## 2019-02-22 DIAGNOSIS — M9905 Segmental and somatic dysfunction of pelvic region: Secondary | ICD-10-CM | POA: Diagnosis not present

## 2019-03-01 DIAGNOSIS — E663 Overweight: Secondary | ICD-10-CM | POA: Diagnosis not present

## 2019-03-01 DIAGNOSIS — F324 Major depressive disorder, single episode, in partial remission: Secondary | ICD-10-CM | POA: Diagnosis not present

## 2019-03-01 DIAGNOSIS — R635 Abnormal weight gain: Secondary | ICD-10-CM | POA: Diagnosis not present

## 2019-03-02 DIAGNOSIS — F341 Dysthymic disorder: Secondary | ICD-10-CM | POA: Diagnosis not present

## 2019-03-03 DIAGNOSIS — M9903 Segmental and somatic dysfunction of lumbar region: Secondary | ICD-10-CM | POA: Diagnosis not present

## 2019-03-03 DIAGNOSIS — M9905 Segmental and somatic dysfunction of pelvic region: Secondary | ICD-10-CM | POA: Diagnosis not present

## 2019-03-03 DIAGNOSIS — M9902 Segmental and somatic dysfunction of thoracic region: Secondary | ICD-10-CM | POA: Diagnosis not present

## 2019-03-03 DIAGNOSIS — M9901 Segmental and somatic dysfunction of cervical region: Secondary | ICD-10-CM | POA: Diagnosis not present

## 2019-03-07 DIAGNOSIS — Z23 Encounter for immunization: Secondary | ICD-10-CM | POA: Diagnosis not present

## 2019-03-08 DIAGNOSIS — F341 Dysthymic disorder: Secondary | ICD-10-CM | POA: Diagnosis not present

## 2019-03-10 DIAGNOSIS — F411 Generalized anxiety disorder: Secondary | ICD-10-CM | POA: Diagnosis not present

## 2019-03-10 DIAGNOSIS — F4310 Post-traumatic stress disorder, unspecified: Secondary | ICD-10-CM | POA: Diagnosis not present

## 2019-03-11 DIAGNOSIS — Z20828 Contact with and (suspected) exposure to other viral communicable diseases: Secondary | ICD-10-CM | POA: Diagnosis not present

## 2019-03-15 DIAGNOSIS — Z01419 Encounter for gynecological examination (general) (routine) without abnormal findings: Secondary | ICD-10-CM | POA: Diagnosis not present

## 2019-03-15 DIAGNOSIS — Z6826 Body mass index (BMI) 26.0-26.9, adult: Secondary | ICD-10-CM | POA: Diagnosis not present

## 2019-03-16 DIAGNOSIS — F341 Dysthymic disorder: Secondary | ICD-10-CM | POA: Diagnosis not present

## 2019-04-04 DIAGNOSIS — M542 Cervicalgia: Secondary | ICD-10-CM | POA: Diagnosis not present

## 2019-04-04 DIAGNOSIS — M546 Pain in thoracic spine: Secondary | ICD-10-CM | POA: Diagnosis not present

## 2019-04-04 DIAGNOSIS — M5414 Radiculopathy, thoracic region: Secondary | ICD-10-CM | POA: Diagnosis not present

## 2019-04-05 DIAGNOSIS — F339 Major depressive disorder, recurrent, unspecified: Secondary | ICD-10-CM | POA: Diagnosis not present

## 2019-04-08 DIAGNOSIS — F411 Generalized anxiety disorder: Secondary | ICD-10-CM | POA: Diagnosis not present

## 2019-04-08 DIAGNOSIS — F4310 Post-traumatic stress disorder, unspecified: Secondary | ICD-10-CM | POA: Diagnosis not present

## 2019-04-13 DIAGNOSIS — F339 Major depressive disorder, recurrent, unspecified: Secondary | ICD-10-CM | POA: Diagnosis not present

## 2019-04-13 DIAGNOSIS — M5414 Radiculopathy, thoracic region: Secondary | ICD-10-CM | POA: Diagnosis not present

## 2019-04-13 DIAGNOSIS — M546 Pain in thoracic spine: Secondary | ICD-10-CM | POA: Diagnosis not present

## 2019-04-13 DIAGNOSIS — M542 Cervicalgia: Secondary | ICD-10-CM | POA: Diagnosis not present

## 2019-04-22 DIAGNOSIS — M5414 Radiculopathy, thoracic region: Secondary | ICD-10-CM | POA: Diagnosis not present

## 2019-04-22 DIAGNOSIS — M546 Pain in thoracic spine: Secondary | ICD-10-CM | POA: Diagnosis not present

## 2019-04-22 DIAGNOSIS — M542 Cervicalgia: Secondary | ICD-10-CM | POA: Diagnosis not present

## 2019-04-25 DIAGNOSIS — F339 Major depressive disorder, recurrent, unspecified: Secondary | ICD-10-CM | POA: Diagnosis not present

## 2019-04-29 DIAGNOSIS — M9901 Segmental and somatic dysfunction of cervical region: Secondary | ICD-10-CM | POA: Diagnosis not present

## 2019-04-29 DIAGNOSIS — M9905 Segmental and somatic dysfunction of pelvic region: Secondary | ICD-10-CM | POA: Diagnosis not present

## 2019-04-29 DIAGNOSIS — M9903 Segmental and somatic dysfunction of lumbar region: Secondary | ICD-10-CM | POA: Diagnosis not present

## 2019-04-29 DIAGNOSIS — M9902 Segmental and somatic dysfunction of thoracic region: Secondary | ICD-10-CM | POA: Diagnosis not present

## 2019-05-06 DIAGNOSIS — M542 Cervicalgia: Secondary | ICD-10-CM | POA: Diagnosis not present

## 2019-05-06 DIAGNOSIS — M5414 Radiculopathy, thoracic region: Secondary | ICD-10-CM | POA: Diagnosis not present

## 2019-05-06 DIAGNOSIS — M546 Pain in thoracic spine: Secondary | ICD-10-CM | POA: Diagnosis not present

## 2019-05-10 DIAGNOSIS — M546 Pain in thoracic spine: Secondary | ICD-10-CM | POA: Diagnosis not present

## 2019-05-10 DIAGNOSIS — M542 Cervicalgia: Secondary | ICD-10-CM | POA: Diagnosis not present

## 2019-05-10 DIAGNOSIS — M5414 Radiculopathy, thoracic region: Secondary | ICD-10-CM | POA: Diagnosis not present

## 2019-05-17 DIAGNOSIS — M542 Cervicalgia: Secondary | ICD-10-CM | POA: Diagnosis not present

## 2019-05-17 DIAGNOSIS — M5414 Radiculopathy, thoracic region: Secondary | ICD-10-CM | POA: Diagnosis not present

## 2019-05-17 DIAGNOSIS — M546 Pain in thoracic spine: Secondary | ICD-10-CM | POA: Diagnosis not present

## 2019-05-20 DIAGNOSIS — M9906 Segmental and somatic dysfunction of lower extremity: Secondary | ICD-10-CM | POA: Diagnosis not present

## 2019-05-20 DIAGNOSIS — M9902 Segmental and somatic dysfunction of thoracic region: Secondary | ICD-10-CM | POA: Diagnosis not present

## 2019-05-20 DIAGNOSIS — M9903 Segmental and somatic dysfunction of lumbar region: Secondary | ICD-10-CM | POA: Diagnosis not present

## 2019-05-20 DIAGNOSIS — M9901 Segmental and somatic dysfunction of cervical region: Secondary | ICD-10-CM | POA: Diagnosis not present

## 2019-05-30 DIAGNOSIS — M546 Pain in thoracic spine: Secondary | ICD-10-CM | POA: Diagnosis not present

## 2019-05-30 DIAGNOSIS — M25572 Pain in left ankle and joints of left foot: Secondary | ICD-10-CM | POA: Diagnosis not present

## 2019-05-30 DIAGNOSIS — M5414 Radiculopathy, thoracic region: Secondary | ICD-10-CM | POA: Diagnosis not present

## 2019-05-30 DIAGNOSIS — M542 Cervicalgia: Secondary | ICD-10-CM | POA: Diagnosis not present

## 2019-06-10 DIAGNOSIS — M5414 Radiculopathy, thoracic region: Secondary | ICD-10-CM | POA: Diagnosis not present

## 2019-06-10 DIAGNOSIS — M25572 Pain in left ankle and joints of left foot: Secondary | ICD-10-CM | POA: Diagnosis not present

## 2019-06-10 DIAGNOSIS — M546 Pain in thoracic spine: Secondary | ICD-10-CM | POA: Diagnosis not present

## 2019-06-10 DIAGNOSIS — M542 Cervicalgia: Secondary | ICD-10-CM | POA: Diagnosis not present

## 2019-06-23 DIAGNOSIS — M546 Pain in thoracic spine: Secondary | ICD-10-CM | POA: Diagnosis not present

## 2019-06-23 DIAGNOSIS — M25572 Pain in left ankle and joints of left foot: Secondary | ICD-10-CM | POA: Diagnosis not present

## 2019-06-23 DIAGNOSIS — F339 Major depressive disorder, recurrent, unspecified: Secondary | ICD-10-CM | POA: Diagnosis not present

## 2019-06-23 DIAGNOSIS — M5414 Radiculopathy, thoracic region: Secondary | ICD-10-CM | POA: Diagnosis not present

## 2019-06-23 DIAGNOSIS — M542 Cervicalgia: Secondary | ICD-10-CM | POA: Diagnosis not present

## 2019-06-30 DIAGNOSIS — F339 Major depressive disorder, recurrent, unspecified: Secondary | ICD-10-CM | POA: Diagnosis not present

## 2019-07-04 DIAGNOSIS — F4310 Post-traumatic stress disorder, unspecified: Secondary | ICD-10-CM | POA: Diagnosis not present

## 2019-07-04 DIAGNOSIS — F411 Generalized anxiety disorder: Secondary | ICD-10-CM | POA: Diagnosis not present

## 2019-07-06 DIAGNOSIS — F341 Dysthymic disorder: Secondary | ICD-10-CM | POA: Diagnosis not present

## 2019-07-07 DIAGNOSIS — M9901 Segmental and somatic dysfunction of cervical region: Secondary | ICD-10-CM | POA: Diagnosis not present

## 2019-07-07 DIAGNOSIS — M9906 Segmental and somatic dysfunction of lower extremity: Secondary | ICD-10-CM | POA: Diagnosis not present

## 2019-07-07 DIAGNOSIS — M9903 Segmental and somatic dysfunction of lumbar region: Secondary | ICD-10-CM | POA: Diagnosis not present

## 2019-07-07 DIAGNOSIS — M9902 Segmental and somatic dysfunction of thoracic region: Secondary | ICD-10-CM | POA: Diagnosis not present

## 2019-07-19 DIAGNOSIS — M9906 Segmental and somatic dysfunction of lower extremity: Secondary | ICD-10-CM | POA: Diagnosis not present

## 2019-07-19 DIAGNOSIS — M9901 Segmental and somatic dysfunction of cervical region: Secondary | ICD-10-CM | POA: Diagnosis not present

## 2019-07-19 DIAGNOSIS — F341 Dysthymic disorder: Secondary | ICD-10-CM | POA: Diagnosis not present

## 2019-07-19 DIAGNOSIS — M9902 Segmental and somatic dysfunction of thoracic region: Secondary | ICD-10-CM | POA: Diagnosis not present

## 2019-07-19 DIAGNOSIS — M9903 Segmental and somatic dysfunction of lumbar region: Secondary | ICD-10-CM | POA: Diagnosis not present

## 2019-07-29 DIAGNOSIS — J309 Allergic rhinitis, unspecified: Secondary | ICD-10-CM | POA: Diagnosis not present

## 2019-07-29 DIAGNOSIS — J019 Acute sinusitis, unspecified: Secondary | ICD-10-CM | POA: Diagnosis not present

## 2019-08-02 DIAGNOSIS — F341 Dysthymic disorder: Secondary | ICD-10-CM | POA: Diagnosis not present

## 2019-08-16 DIAGNOSIS — M9903 Segmental and somatic dysfunction of lumbar region: Secondary | ICD-10-CM | POA: Diagnosis not present

## 2019-08-16 DIAGNOSIS — F341 Dysthymic disorder: Secondary | ICD-10-CM | POA: Diagnosis not present

## 2019-08-16 DIAGNOSIS — M9902 Segmental and somatic dysfunction of thoracic region: Secondary | ICD-10-CM | POA: Diagnosis not present

## 2019-08-16 DIAGNOSIS — M9901 Segmental and somatic dysfunction of cervical region: Secondary | ICD-10-CM | POA: Diagnosis not present

## 2019-08-16 DIAGNOSIS — M9906 Segmental and somatic dysfunction of lower extremity: Secondary | ICD-10-CM | POA: Diagnosis not present

## 2019-08-23 DIAGNOSIS — F341 Dysthymic disorder: Secondary | ICD-10-CM | POA: Diagnosis not present

## 2019-08-26 DIAGNOSIS — M9906 Segmental and somatic dysfunction of lower extremity: Secondary | ICD-10-CM | POA: Diagnosis not present

## 2019-08-26 DIAGNOSIS — M9901 Segmental and somatic dysfunction of cervical region: Secondary | ICD-10-CM | POA: Diagnosis not present

## 2019-08-26 DIAGNOSIS — M9903 Segmental and somatic dysfunction of lumbar region: Secondary | ICD-10-CM | POA: Diagnosis not present

## 2019-08-26 DIAGNOSIS — M9902 Segmental and somatic dysfunction of thoracic region: Secondary | ICD-10-CM | POA: Diagnosis not present

## 2019-09-06 DIAGNOSIS — M9902 Segmental and somatic dysfunction of thoracic region: Secondary | ICD-10-CM | POA: Diagnosis not present

## 2019-09-06 DIAGNOSIS — M9903 Segmental and somatic dysfunction of lumbar region: Secondary | ICD-10-CM | POA: Diagnosis not present

## 2019-09-06 DIAGNOSIS — M9906 Segmental and somatic dysfunction of lower extremity: Secondary | ICD-10-CM | POA: Diagnosis not present

## 2019-09-06 DIAGNOSIS — F341 Dysthymic disorder: Secondary | ICD-10-CM | POA: Diagnosis not present

## 2019-09-06 DIAGNOSIS — M9901 Segmental and somatic dysfunction of cervical region: Secondary | ICD-10-CM | POA: Diagnosis not present

## 2019-09-12 DIAGNOSIS — J0100 Acute maxillary sinusitis, unspecified: Secondary | ICD-10-CM | POA: Diagnosis not present

## 2019-09-14 DIAGNOSIS — M9902 Segmental and somatic dysfunction of thoracic region: Secondary | ICD-10-CM | POA: Diagnosis not present

## 2019-09-14 DIAGNOSIS — M9901 Segmental and somatic dysfunction of cervical region: Secondary | ICD-10-CM | POA: Diagnosis not present

## 2019-09-14 DIAGNOSIS — M9903 Segmental and somatic dysfunction of lumbar region: Secondary | ICD-10-CM | POA: Diagnosis not present

## 2019-09-14 DIAGNOSIS — M9906 Segmental and somatic dysfunction of lower extremity: Secondary | ICD-10-CM | POA: Diagnosis not present

## 2019-09-27 DIAGNOSIS — M9901 Segmental and somatic dysfunction of cervical region: Secondary | ICD-10-CM | POA: Diagnosis not present

## 2019-09-27 DIAGNOSIS — M9903 Segmental and somatic dysfunction of lumbar region: Secondary | ICD-10-CM | POA: Diagnosis not present

## 2019-09-27 DIAGNOSIS — M9902 Segmental and somatic dysfunction of thoracic region: Secondary | ICD-10-CM | POA: Diagnosis not present

## 2019-09-27 DIAGNOSIS — M9906 Segmental and somatic dysfunction of lower extremity: Secondary | ICD-10-CM | POA: Diagnosis not present

## 2019-10-03 DIAGNOSIS — M9906 Segmental and somatic dysfunction of lower extremity: Secondary | ICD-10-CM | POA: Diagnosis not present

## 2019-10-03 DIAGNOSIS — M9901 Segmental and somatic dysfunction of cervical region: Secondary | ICD-10-CM | POA: Diagnosis not present

## 2019-10-03 DIAGNOSIS — M9902 Segmental and somatic dysfunction of thoracic region: Secondary | ICD-10-CM | POA: Diagnosis not present

## 2019-10-03 DIAGNOSIS — M9903 Segmental and somatic dysfunction of lumbar region: Secondary | ICD-10-CM | POA: Diagnosis not present

## 2019-10-12 DIAGNOSIS — M542 Cervicalgia: Secondary | ICD-10-CM | POA: Diagnosis not present

## 2019-10-12 DIAGNOSIS — M5414 Radiculopathy, thoracic region: Secondary | ICD-10-CM | POA: Diagnosis not present

## 2019-10-12 DIAGNOSIS — M25572 Pain in left ankle and joints of left foot: Secondary | ICD-10-CM | POA: Diagnosis not present

## 2019-10-12 DIAGNOSIS — M546 Pain in thoracic spine: Secondary | ICD-10-CM | POA: Diagnosis not present

## 2019-10-28 DIAGNOSIS — M9906 Segmental and somatic dysfunction of lower extremity: Secondary | ICD-10-CM | POA: Diagnosis not present

## 2019-10-28 DIAGNOSIS — J342 Deviated nasal septum: Secondary | ICD-10-CM | POA: Diagnosis not present

## 2019-10-28 DIAGNOSIS — J0141 Acute recurrent pansinusitis: Secondary | ICD-10-CM | POA: Diagnosis not present

## 2019-10-28 DIAGNOSIS — J302 Other seasonal allergic rhinitis: Secondary | ICD-10-CM | POA: Diagnosis not present

## 2019-10-28 DIAGNOSIS — M9903 Segmental and somatic dysfunction of lumbar region: Secondary | ICD-10-CM | POA: Diagnosis not present

## 2019-10-28 DIAGNOSIS — M9902 Segmental and somatic dysfunction of thoracic region: Secondary | ICD-10-CM | POA: Diagnosis not present

## 2019-10-28 DIAGNOSIS — M9901 Segmental and somatic dysfunction of cervical region: Secondary | ICD-10-CM | POA: Diagnosis not present

## 2019-10-28 DIAGNOSIS — J343 Hypertrophy of nasal turbinates: Secondary | ICD-10-CM | POA: Diagnosis not present

## 2019-12-09 DIAGNOSIS — Z23 Encounter for immunization: Secondary | ICD-10-CM | POA: Diagnosis not present

## 2019-12-26 DIAGNOSIS — M542 Cervicalgia: Secondary | ICD-10-CM | POA: Diagnosis not present

## 2019-12-26 DIAGNOSIS — M25572 Pain in left ankle and joints of left foot: Secondary | ICD-10-CM | POA: Diagnosis not present

## 2019-12-26 DIAGNOSIS — M546 Pain in thoracic spine: Secondary | ICD-10-CM | POA: Diagnosis not present

## 2019-12-26 DIAGNOSIS — M5414 Radiculopathy, thoracic region: Secondary | ICD-10-CM | POA: Diagnosis not present

## 2020-01-03 DIAGNOSIS — F411 Generalized anxiety disorder: Secondary | ICD-10-CM | POA: Diagnosis not present

## 2020-02-01 DIAGNOSIS — F341 Dysthymic disorder: Secondary | ICD-10-CM | POA: Diagnosis not present

## 2020-02-08 DIAGNOSIS — F341 Dysthymic disorder: Secondary | ICD-10-CM | POA: Diagnosis not present

## 2020-02-13 DIAGNOSIS — J029 Acute pharyngitis, unspecified: Secondary | ICD-10-CM | POA: Diagnosis not present

## 2020-03-27 DIAGNOSIS — F341 Dysthymic disorder: Secondary | ICD-10-CM | POA: Diagnosis not present

## 2020-04-10 DIAGNOSIS — F341 Dysthymic disorder: Secondary | ICD-10-CM | POA: Diagnosis not present

## 2020-05-01 DIAGNOSIS — Z01419 Encounter for gynecological examination (general) (routine) without abnormal findings: Secondary | ICD-10-CM | POA: Diagnosis not present

## 2020-05-01 DIAGNOSIS — Z6822 Body mass index (BMI) 22.0-22.9, adult: Secondary | ICD-10-CM | POA: Diagnosis not present

## 2020-05-08 DIAGNOSIS — F341 Dysthymic disorder: Secondary | ICD-10-CM | POA: Diagnosis not present

## 2020-05-10 DIAGNOSIS — F411 Generalized anxiety disorder: Secondary | ICD-10-CM | POA: Diagnosis not present

## 2020-07-04 DIAGNOSIS — F341 Dysthymic disorder: Secondary | ICD-10-CM | POA: Diagnosis not present

## 2020-07-17 DIAGNOSIS — M9903 Segmental and somatic dysfunction of lumbar region: Secondary | ICD-10-CM | POA: Diagnosis not present

## 2020-07-17 DIAGNOSIS — M9906 Segmental and somatic dysfunction of lower extremity: Secondary | ICD-10-CM | POA: Diagnosis not present

## 2020-07-17 DIAGNOSIS — M9901 Segmental and somatic dysfunction of cervical region: Secondary | ICD-10-CM | POA: Diagnosis not present

## 2020-07-17 DIAGNOSIS — M9902 Segmental and somatic dysfunction of thoracic region: Secondary | ICD-10-CM | POA: Diagnosis not present

## 2020-07-30 DIAGNOSIS — F329 Major depressive disorder, single episode, unspecified: Secondary | ICD-10-CM | POA: Diagnosis not present

## 2020-07-30 DIAGNOSIS — F4310 Post-traumatic stress disorder, unspecified: Secondary | ICD-10-CM | POA: Diagnosis not present

## 2020-07-30 DIAGNOSIS — F411 Generalized anxiety disorder: Secondary | ICD-10-CM | POA: Diagnosis not present

## 2020-08-14 ENCOUNTER — Ambulatory Visit (INDEPENDENT_AMBULATORY_CARE_PROVIDER_SITE_OTHER): Payer: BC Managed Care – PPO | Admitting: Otolaryngology

## 2020-08-14 ENCOUNTER — Other Ambulatory Visit: Payer: Self-pay

## 2020-08-14 DIAGNOSIS — F411 Generalized anxiety disorder: Secondary | ICD-10-CM | POA: Diagnosis not present

## 2020-08-14 DIAGNOSIS — J3501 Chronic tonsillitis: Secondary | ICD-10-CM | POA: Diagnosis not present

## 2020-08-14 DIAGNOSIS — J358 Other chronic diseases of tonsils and adenoids: Secondary | ICD-10-CM | POA: Diagnosis not present

## 2020-08-14 NOTE — Progress Notes (Signed)
HPI: Victoria Wu is a 25 y.o. female who presents is referred by her dentist Dr. Leticia Penna for evaluation of chronic tonsil problems.  She has had a long history of recurrent tonsil infections in the past.  When she was a child she used to get recurrent strep and was frequently treated with antibiotics.  As she has become an adult.  She gets frequent tonsil stones and sore throats associated with the tonsil stones.  She has tried oral hygiene per her dentist recommendation that he felt like she should consider tonsillectomy.  Whenever she has sinus issues her tonsils swell up and she gets sore throat. She is otherwise healthy.  NKDA. Meds include Wellbutrin and birth control pill  No past medical history on file. No past surgical history on file. Social History   Socioeconomic History   Marital status: Single    Spouse name: Not on file   Number of children: Not on file   Years of education: Not on file   Highest education level: Not on file  Occupational History   Not on file  Tobacco Use   Smoking status: Never   Smokeless tobacco: Never  Substance and Sexual Activity   Alcohol use: No   Drug use: No   Sexual activity: Never  Other Topics Concern   Not on file  Social History Narrative   Not on file   Social Determinants of Health   Financial Resource Strain: Not on file  Food Insecurity: Not on file  Transportation Needs: Not on file  Physical Activity: Not on file  Stress: Not on file  Social Connections: Not on file   No family history on file. No Known Allergies Prior to Admission medications   Medication Sig Start Date End Date Taking? Authorizing Provider  acetaminophen (TYLENOL) 500 MG tablet Take 1,000 mg by mouth every 6 (six) hours as needed for mild pain or moderate pain.    [provider]  ibuprofen (ADVIL,MOTRIN) 200 MG tablet Take 600 mg by mouth every 6 (six) hours as needed for mild pain or moderate pain.    [provider]      Positive ROS: Otherwise negative  All other systems have been reviewed and were otherwise negative with the exception of those mentioned in the HPI and as above.  Physical Exam: Constitutional: Alert, well-appearing, no acute distress Ears: External ears without lesions or tenderness. Ear canals are clear bilaterally with intact, clear TMs.  Nasal: External nose without lesions. Septum is slightly deviated to the left.  Both middle meatus regions are clear with no signs of infection.  No polyps.  Mild rhinitis.. Clear nasal passages otherwise. Oral: Lips and gums without lesions. Tongue and palate mucosa without lesions. Posterior oropharynx clear.  She has moderate size embedded tonsils bilaterally with several large crypts. Neck: No palpable adenopathy or masses Lungs: Clear to auscultation Respiratory: Breathing comfortably Cardiac exam: Regular rate and rhythm without murmur Skin: No facial/neck lesions or rash noted.  Procedures  Assessment: Chronic tonsillitis with history of recurrent sore throats and frequent tonsil stones.  Plan: Discussed the morbidity associated with tonsillectomy and she would have to be out of work and have a school recovering for 9 to 10 days.  She would like to schedule the tonsillectomy this summer or possibly over Christmas break as she is starting grad school in the fall.   Narda Bonds, MD   CC:

## 2020-08-15 DIAGNOSIS — F341 Dysthymic disorder: Secondary | ICD-10-CM | POA: Diagnosis not present

## 2020-09-20 DIAGNOSIS — M9901 Segmental and somatic dysfunction of cervical region: Secondary | ICD-10-CM | POA: Diagnosis not present

## 2020-09-20 DIAGNOSIS — M9902 Segmental and somatic dysfunction of thoracic region: Secondary | ICD-10-CM | POA: Diagnosis not present

## 2020-09-20 DIAGNOSIS — M9906 Segmental and somatic dysfunction of lower extremity: Secondary | ICD-10-CM | POA: Diagnosis not present

## 2020-09-20 DIAGNOSIS — M9903 Segmental and somatic dysfunction of lumbar region: Secondary | ICD-10-CM | POA: Diagnosis not present

## 2020-09-27 DIAGNOSIS — L509 Urticaria, unspecified: Secondary | ICD-10-CM | POA: Diagnosis not present

## 2020-10-16 DIAGNOSIS — F341 Dysthymic disorder: Secondary | ICD-10-CM | POA: Diagnosis not present

## 2020-11-02 DIAGNOSIS — Z23 Encounter for immunization: Secondary | ICD-10-CM | POA: Diagnosis not present

## 2020-12-12 DIAGNOSIS — M542 Cervicalgia: Secondary | ICD-10-CM | POA: Diagnosis not present

## 2020-12-12 DIAGNOSIS — M546 Pain in thoracic spine: Secondary | ICD-10-CM | POA: Diagnosis not present

## 2020-12-12 DIAGNOSIS — M9905 Segmental and somatic dysfunction of pelvic region: Secondary | ICD-10-CM | POA: Diagnosis not present

## 2020-12-12 DIAGNOSIS — M9906 Segmental and somatic dysfunction of lower extremity: Secondary | ICD-10-CM | POA: Diagnosis not present

## 2020-12-12 DIAGNOSIS — M9902 Segmental and somatic dysfunction of thoracic region: Secondary | ICD-10-CM | POA: Diagnosis not present

## 2020-12-12 DIAGNOSIS — M7918 Myalgia, other site: Secondary | ICD-10-CM | POA: Diagnosis not present

## 2020-12-12 DIAGNOSIS — M9903 Segmental and somatic dysfunction of lumbar region: Secondary | ICD-10-CM | POA: Diagnosis not present

## 2020-12-12 DIAGNOSIS — M545 Low back pain, unspecified: Secondary | ICD-10-CM | POA: Diagnosis not present

## 2020-12-12 DIAGNOSIS — M25671 Stiffness of right ankle, not elsewhere classified: Secondary | ICD-10-CM | POA: Diagnosis not present

## 2020-12-12 DIAGNOSIS — M25652 Stiffness of left hip, not elsewhere classified: Secondary | ICD-10-CM | POA: Diagnosis not present

## 2020-12-12 DIAGNOSIS — M25651 Stiffness of right hip, not elsewhere classified: Secondary | ICD-10-CM | POA: Diagnosis not present

## 2020-12-12 DIAGNOSIS — M9901 Segmental and somatic dysfunction of cervical region: Secondary | ICD-10-CM | POA: Diagnosis not present

## 2020-12-28 DIAGNOSIS — J3503 Chronic tonsillitis and adenoiditis: Secondary | ICD-10-CM | POA: Diagnosis not present

## 2020-12-28 DIAGNOSIS — J353 Hypertrophy of tonsils with hypertrophy of adenoids: Secondary | ICD-10-CM | POA: Diagnosis not present

## 2021-01-07 ENCOUNTER — Other Ambulatory Visit: Payer: Self-pay | Admitting: Otolaryngology

## 2021-01-16 DIAGNOSIS — M9903 Segmental and somatic dysfunction of lumbar region: Secondary | ICD-10-CM | POA: Diagnosis not present

## 2021-01-16 DIAGNOSIS — M9901 Segmental and somatic dysfunction of cervical region: Secondary | ICD-10-CM | POA: Diagnosis not present

## 2021-01-16 DIAGNOSIS — M9902 Segmental and somatic dysfunction of thoracic region: Secondary | ICD-10-CM | POA: Diagnosis not present

## 2021-01-16 DIAGNOSIS — M9906 Segmental and somatic dysfunction of lower extremity: Secondary | ICD-10-CM | POA: Diagnosis not present

## 2021-01-25 ENCOUNTER — Encounter (HOSPITAL_BASED_OUTPATIENT_CLINIC_OR_DEPARTMENT_OTHER): Payer: Self-pay | Admitting: Otolaryngology

## 2021-01-25 ENCOUNTER — Other Ambulatory Visit: Payer: Self-pay

## 2021-02-01 ENCOUNTER — Other Ambulatory Visit: Payer: Self-pay

## 2021-02-01 ENCOUNTER — Ambulatory Visit (HOSPITAL_BASED_OUTPATIENT_CLINIC_OR_DEPARTMENT_OTHER): Payer: BC Managed Care – PPO | Admitting: Anesthesiology

## 2021-02-01 ENCOUNTER — Encounter (HOSPITAL_BASED_OUTPATIENT_CLINIC_OR_DEPARTMENT_OTHER)
Admission: RE | Disposition: A | Discharge status: Home / Self Care | Payer: Self-pay | Source: Home / Self Care | Attending: Otolaryngology

## 2021-02-01 ENCOUNTER — Encounter (HOSPITAL_BASED_OUTPATIENT_CLINIC_OR_DEPARTMENT_OTHER): Payer: Self-pay | Admitting: Otolaryngology

## 2021-02-01 ENCOUNTER — Ambulatory Visit (HOSPITAL_BASED_OUTPATIENT_CLINIC_OR_DEPARTMENT_OTHER)
Admission: RE | Admit: 2021-02-01 | Discharge: 2021-02-01 | Disposition: A | Discharge status: Home / Self Care | Payer: BC Managed Care – PPO | Attending: Otolaryngology | Admitting: Otolaryngology

## 2021-02-01 DIAGNOSIS — J3503 Chronic tonsillitis and adenoiditis: Secondary | ICD-10-CM | POA: Diagnosis not present

## 2021-02-01 DIAGNOSIS — J353 Hypertrophy of tonsils with hypertrophy of adenoids: Secondary | ICD-10-CM | POA: Insufficient documentation

## 2021-02-01 DIAGNOSIS — J312 Chronic pharyngitis: Secondary | ICD-10-CM | POA: Diagnosis not present

## 2021-02-01 DIAGNOSIS — J3501 Chronic tonsillitis: Secondary | ICD-10-CM | POA: Insufficient documentation

## 2021-02-01 DIAGNOSIS — J029 Acute pharyngitis, unspecified: Secondary | ICD-10-CM | POA: Diagnosis not present

## 2021-02-01 HISTORY — DX: Hypertrophy of tonsils with hypertrophy of adenoids: J35.3

## 2021-02-01 HISTORY — DX: Brachial plexus disorders: G54.0

## 2021-02-01 HISTORY — DX: Other specified postprocedural states: Z98.890

## 2021-02-01 HISTORY — PX: TONSILLECTOMY AND ADENOIDECTOMY: SHX28

## 2021-02-01 HISTORY — DX: Nausea with vomiting, unspecified: R11.2

## 2021-02-01 LAB — POCT PREGNANCY, URINE: Preg Test, Ur: NEGATIVE

## 2021-02-01 SURGERY — TONSILLECTOMY AND ADENOIDECTOMY
Anesthesia: General | Site: Mouth

## 2021-02-01 MED ORDER — PROPOFOL 10 MG/ML IV BOLUS
INTRAVENOUS | Status: AC
  Filled 2021-02-01: qty 20

## 2021-02-01 MED ORDER — MIDAZOLAM HCL 2 MG/2ML IJ SOLN
INTRAMUSCULAR | Status: AC
  Filled 2021-02-01: qty 2

## 2021-02-01 MED ORDER — DEXAMETHASONE SODIUM PHOSPHATE 4 MG/ML IJ SOLN
INTRAMUSCULAR | Status: DC | PRN
  Administered 2021-02-01: 8 mg via INTRAVENOUS

## 2021-02-01 MED ORDER — AMISULPRIDE (ANTIEMETIC) 5 MG/2ML IV SOLN: 10.0000 mg | Freq: Once | INTRAVENOUS | Status: DC | PRN

## 2021-02-01 MED ORDER — FENTANYL CITRATE (PF) 100 MCG/2ML IJ SOLN
INTRAMUSCULAR | Status: AC
  Filled 2021-02-01: qty 2

## 2021-02-01 MED ORDER — OXYCODONE-ACETAMINOPHEN 5-325 MG PO TABS: 1.0000 | ORAL_TABLET | ORAL | 0 refills | Status: AC | PRN

## 2021-02-01 MED ORDER — ONDANSETRON HCL 4 MG/2ML IJ SOLN: 4.0000 mg | Freq: Once | INTRAMUSCULAR | Status: DC | PRN

## 2021-02-01 MED ORDER — OXYCODONE HCL 5 MG PO TABS: 5.0000 mg | ORAL_TABLET | Freq: Once | ORAL | Status: DC | PRN

## 2021-02-01 MED ORDER — ACETAMINOPHEN 10 MG/ML IV SOLN
INTRAVENOUS | Status: DC | PRN
  Administered 2021-02-01: 1000 mg via INTRAVENOUS

## 2021-02-01 MED ORDER — LIDOCAINE HCL (CARDIAC) PF 100 MG/5ML IV SOSY
PREFILLED_SYRINGE | INTRAVENOUS | Status: DC | PRN
Start: 2021-02-01 — End: 2021-02-01
  Administered 2021-02-01: 100 mg via INTRAVENOUS

## 2021-02-01 MED ORDER — FENTANYL CITRATE (PF) 100 MCG/2ML IJ SOLN
INTRAMUSCULAR | Status: DC | PRN
  Administered 2021-02-01: 50 ug via INTRAVENOUS
  Administered 2021-02-01: 25 ug via INTRAVENOUS
  Administered 2021-02-01 (×2): 50 ug via INTRAVENOUS

## 2021-02-01 MED ORDER — ONDANSETRON HCL 4 MG/2ML IJ SOLN
INTRAMUSCULAR | Status: DC | PRN
  Administered 2021-02-01: 4 mg via INTRAVENOUS

## 2021-02-01 MED ORDER — ACETAMINOPHEN 500 MG PO TABS: 1000.0000 mg | ORAL_TABLET | Freq: Once | ORAL | Status: DC

## 2021-02-01 MED ORDER — OXYCODONE HCL 5 MG/5ML PO SOLN: 5.0000 mg | Freq: Once | ORAL | Status: DC | PRN

## 2021-02-01 MED ORDER — AZITHROMYCIN 200 MG/5ML PO SUSR: 500.0000 mg | Freq: Every day | ORAL | 0 refills | Status: AC

## 2021-02-01 MED ORDER — SODIUM CHLORIDE 0.9 % IR SOLN
Status: DC | PRN
  Administered 2021-02-01: 100 mL

## 2021-02-01 MED ORDER — PROPOFOL 10 MG/ML IV BOLUS
INTRAVENOUS | Status: DC | PRN
  Administered 2021-02-01: 200 mg via INTRAVENOUS

## 2021-02-01 MED ORDER — FENTANYL CITRATE (PF) 100 MCG/2ML IJ SOLN
25.0000 ug | INTRAMUSCULAR | Status: DC | PRN
  Administered 2021-02-01 (×2): 25 ug via INTRAVENOUS

## 2021-02-01 MED ORDER — ACETAMINOPHEN 10 MG/ML IV SOLN
INTRAVENOUS | Status: AC
  Filled 2021-02-01: qty 100

## 2021-02-01 MED ORDER — MIDAZOLAM HCL 5 MG/5ML IJ SOLN
INTRAMUSCULAR | Status: DC | PRN
  Administered 2021-02-01: 2 mg via INTRAVENOUS

## 2021-02-01 MED ORDER — OXYMETAZOLINE HCL 0.05 % NA SOLN
NASAL | Status: DC | PRN
  Administered 2021-02-01: 1 via TOPICAL

## 2021-02-01 MED ORDER — ROCURONIUM BROMIDE 100 MG/10ML IV SOLN
INTRAVENOUS | Status: DC | PRN
  Administered 2021-02-01: 50 mg via INTRAVENOUS

## 2021-02-01 MED ORDER — SUGAMMADEX SODIUM 200 MG/2ML IV SOLN
INTRAVENOUS | Status: DC | PRN
  Administered 2021-02-01: 143.4 mg via INTRAVENOUS

## 2021-02-01 MED ORDER — OXYMETAZOLINE HCL 0.05 % NA SOLN
NASAL | Status: AC
  Filled 2021-02-01: qty 30

## 2021-02-01 MED ORDER — LACTATED RINGERS IV SOLN
INTRAVENOUS | Status: DC
  Administered 2021-02-01: 10 mL via INTRAVENOUS

## 2021-02-01 SURGICAL SUPPLY — 30 items
BNDG CMPR 5X2 CHSV 1 LYR STRL (GAUZE/BANDAGES/DRESSINGS)
BNDG COHESIVE 2X5 TAN ST LF (GAUZE/BANDAGES/DRESSINGS) IMPLANT
CANISTER SUCT 1200ML W/VALVE (MISCELLANEOUS) ×2 IMPLANT
CATH ROBINSON RED A/P 10FR (CATHETERS) IMPLANT
CATH ROBINSON RED A/P 14FR (CATHETERS) IMPLANT
COAGULATOR SUCT SWTCH 10FR 6 (ELECTROSURGICAL) IMPLANT
COVER BACK TABLE 60X90IN (DRAPES) ×2 IMPLANT
COVER MAYO STAND STRL (DRAPES) ×2 IMPLANT
DEFOGGER MIRROR 1QT (MISCELLANEOUS) ×2 IMPLANT
ELECT REM PT RETURN 9FT ADLT (ELECTROSURGICAL) ×2
ELECT REM PT RETURN 9FT PED (ELECTROSURGICAL) ×2
ELECTRODE REM PT RETRN 9FT PED (ELECTROSURGICAL) ×1 IMPLANT
ELECTRODE REM PT RTRN 9FT ADLT (ELECTROSURGICAL) ×1 IMPLANT
GAUZE SPONGE 4X4 12PLY STRL LF (GAUZE/BANDAGES/DRESSINGS) ×2 IMPLANT
GLOVE SURG ENC MOIS LTX SZ7.5 (GLOVE) ×2 IMPLANT
GOWN STRL REUS W/ TWL LRG LVL3 (GOWN DISPOSABLE) ×2 IMPLANT
GOWN STRL REUS W/TWL LRG LVL3 (GOWN DISPOSABLE) ×4
IV NS 500ML (IV SOLUTION) ×2
IV NS 500ML BAXH (IV SOLUTION) ×1 IMPLANT
MARKER SKIN DUAL TIP RULER LAB (MISCELLANEOUS) IMPLANT
NS IRRIG 1000ML POUR BTL (IV SOLUTION) ×2 IMPLANT
SHEET MEDIUM DRAPE 40X70 STRL (DRAPES) ×2 IMPLANT
SLEEVE SCD COMPRESS KNEE MED (STOCKING) ×2 IMPLANT
SPONGE TONSIL 1.25 RF SGL STRG (GAUZE/BANDAGES/DRESSINGS) ×2 IMPLANT
SYR BULB EAR ULCER 3OZ GRN STR (SYRINGE) IMPLANT
TOWEL GREEN STERILE FF (TOWEL DISPOSABLE) ×2 IMPLANT
TUBE CONNECTING 20X1/4 (TUBING) ×2 IMPLANT
TUBE SALEM SUMP 12R W/ARV (TUBING) IMPLANT
TUBE SALEM SUMP 16 FR W/ARV (TUBING) ×2 IMPLANT
WAND COBLATOR 70 EVAC XTRA (SURGICAL WAND) ×2 IMPLANT

## 2021-02-01 NOTE — Discharge Instructions (Addendum)
NEXT dose of Tylenol after 3:30pm as needed for pain.   [facility] Postoperative Instructions for Tonsillectomy & Adenoidectomy (T&A) Activity Restrict activity at home for the first two days, resting as much as possible. Light indoor activity is best. You may usually return to school or work within a week but void strenuous activity and sports for two weeks. Sleep with your head elevated on 2-3 pillows for 3-4 days to help decrease swelling. Diet Due to tissue swelling and throat discomfort, you may have little desire to drink for several days. However fluids are very important to prevent dehydration. You will find that non-acidic juices, soups, popsicles, Jell-O, custard, puddings, and any soft or mashed foods taken in small quantities can be swallowed fairly easily. Try to increase your fluid and food intake as the discomfort subsides. It is recommended that a child receive 1-1/2 quarts of fluid in a 24-hour period. Adult require twice this amount.  Discomfort Your sore throat may be relieved by applying an ice collar to your neck and/or by taking Tylenol. You may experience an earache, which is due to referred pain from the throat. Referred ear pain is commonly felt at night when trying to rest.  Bleeding                        Although rare, there is risk of having some bleeding during the first 2 weeks after having a T&A. This usually happens between days 7-10 postoperatively. If you or your child should have any bleeding, try to remain calm. We recommend sitting up quietly in a chair and gently spitting out the blood into a bowl. For adults, gargling gently with ice water may help. If the bleeding does not stop after a short time (5 minutes), is more than 1 teaspoonful, or if you become worried, please call our office at [phone] or go directly to the nearest hospital emergency room. Do not eat or drink anything prior to going to the hospital as you may need to be taken to the  operating room in order to control the bleeding. GENERAL CONSIDERATIONS Brush your teeth regularly. Avoid mouthwashes and gargles for three weeks. You may gargle gently with warm salt-water as necessary or spray with Chloraseptic. You may make salt-water by placing 2 teaspoons of table salt into a quart of fresh water. Warm the salt-water in a microwave to a luke warm temperature.  Avoid exposure to colds and upper respiratory infections if possible.  If you look into a mirror or into your child's mouth, you will see white-gray patches in the back of the throat. This is normal after having a T&A and is like a scab that forms on the skin after an abrasion. It will disappear once the back of the throat heals completely. However, it may cause a noticeable odor; this too will disappear with time. Again, warm salt-water gargles may be used to help keep the throat clean and promote healing.  You may notice a temporary change in voice quality, such as a higher pitched voice or a nasal sound, until healing is complete. This may last for 1-2 weeks and should resolve.  Do not take or give you child any medications that we have not prescribed or recommended.  Snoring may occur, especially at night, for the first week after a T&A. It is due to swelling of the soft palate and will usually resolve.  Please call our office at [phone] if you  have any questions.     Post Anesthesia Home Care Instructions  Activity: Get plenty of rest for the remainder of the day. A responsible individual must stay with you for 24 hours following the procedure.  For the next 24 hours, DO NOT: -Drive a car -Advertising copywriter -Drink alcoholic beverages -Take any medication unless instructed by your physician -Make any legal decisions or sign important papers.  Meals: Start with liquid foods such as gelatin or soup. Progress to regular foods as tolerated. Avoid greasy, spicy, heavy foods. If nausea and/or vomiting occur,  drink only clear liquids until the nausea and/or vomiting subsides. Call your physician if vomiting continues.  Special Instructions/Symptoms: Your throat may feel dry or sore from the anesthesia or the breathing tube placed in your throat during surgery. If this causes discomfort, gargle with warm salt water. The discomfort should disappear within 24 hours.  If you had a scopolamine patch placed behind your ear for the management of post- operative nausea and/or vomiting:  1. The medication in the patch is effective for 72 hours, after which it should be removed.  Wrap patch in a tissue and discard in the trash. Wash hands thoroughly with soap and water. 2. You may remove the patch earlier than 72 hours if you experience unpleasant side effects which may include dry mouth, dizziness or visual disturbances. 3. Avoid touching the patch. Wash your hands with soap and water after contact with the patch.

## 2021-02-01 NOTE — Transfer of Care (Signed)
Immediate Anesthesia Transfer of Care Note  Patient: Victoria Wu  Procedure(s) Performed: TONSILLECTOMY AND POSSIBLE ADENOIDECTOMY (Mouth)  Patient Location: PACU  Anesthesia Type:General  Level of Consciousness: drowsy  Airway & Oxygen Therapy: Patient Spontanous Breathing and Patient connected to face mask oxygen  Post-op Assessment: Report given to RN and Post -op Vital signs reviewed and stable  Post vital signs: Reviewed and stable  Last Vitals:  Vitals Value Taken Time  BP 142/95 02/01/21 0925  Temp    Pulse 96 02/01/21 0929  Resp 19 02/01/21 0929  SpO2 100 % 02/01/21 0929  Vitals shown include unvalidated device data.  Last Pain:  Vitals:   02/01/21 0736  TempSrc: Oral  PainSc: 0-No pain      Patients Stated Pain Goal: 4 (02/01/21 0736)  Complications: No notable events documented.

## 2021-02-01 NOTE — Anesthesia Procedure Notes (Signed)
Procedure Name: Intubation Date/Time: 02/01/2021 8:51 AM Performed by: Ezequiel Kayser, CRNA Pre-anesthesia Checklist: Patient identified, Emergency Drugs available, Suction available and Patient being monitored Patient Re-evaluated:Patient Re-evaluated prior to induction Oxygen Delivery Method: Circle System Utilized Preoxygenation: Pre-oxygenation with 100% oxygen Induction Type: IV induction Ventilation: Mask ventilation without difficulty Laryngoscope Size: Mac and 3 Grade View: Grade I Tube type: Oral Tube size: 7.0 mm Number of attempts: 1 Airway Equipment and Method: Stylet and Oral airway Placement Confirmation: ETT inserted through vocal cords under direct vision, positive ETCO2 and breath sounds checked- equal and bilateral Secured at: 21 cm Tube secured with: Tape Dental Injury: Teeth and Oropharynx as per pre-operative assessment

## 2021-02-01 NOTE — Anesthesia Preprocedure Evaluation (Signed)
Anesthesia Evaluation  Patient identified by MRN, date of birth, ID band Patient awake    Reviewed: Allergy & Precautions, NPO status , Patient's Chart, lab work & pertinent test results  History of Anesthesia Complications Negative for: history of anesthetic complications  Airway Mallampati: I  TM Distance: >3 FB Neck ROM: Full    Dental  (+) Teeth Intact, Dental Advisory Given   Pulmonary neg pulmonary ROS,    Pulmonary exam normal        Cardiovascular negative cardio ROS Normal cardiovascular exam     Neuro/Psych negative neurological ROS     GI/Hepatic negative GI ROS, Neg liver ROS,   Endo/Other  negative endocrine ROS  Renal/GU negative Renal ROS  negative genitourinary   Musculoskeletal negative musculoskeletal ROS (+)   Abdominal   Peds  Hematology negative hematology ROS (+)   Anesthesia Other Findings  Thoracic outlet syndrome s/p rib resection- all symptoms resolved  Adenotonsillar hypertrophy  Reproductive/Obstetrics                             Anesthesia Physical Anesthesia Plan  ASA: 2  Anesthesia Plan: General   Post-op Pain Management: Toradol IV (intra-op) and Tylenol PO (pre-op)   Induction: Intravenous  PONV Risk Score and Plan: 3 and Ondansetron, Dexamethasone, Treatment may vary due to age or medical condition and Midazolam  Airway Management Planned: Oral ETT  Additional Equipment: None  Intra-op Plan:   Post-operative Plan: Extubation in OR  Informed Consent: I have reviewed the patients History and Physical, chart, labs and discussed the procedure including the risks, benefits and alternatives for the proposed anesthesia with the patient or authorized representative who has indicated his/her understanding and acceptance.     Dental advisory given  Plan Discussed with:   Anesthesia Plan Comments:         Anesthesia Quick Evaluation

## 2021-02-01 NOTE — Anesthesia Postprocedure Evaluation (Signed)
Anesthesia Post Note  Patient: Victoria Wu  Procedure(s) Performed: TONSILLECTOMY AND POSSIBLE ADENOIDECTOMY (Mouth)     Patient location during evaluation: PACU Anesthesia Type: General Level of consciousness: awake and alert Pain management: pain level controlled Vital Signs Assessment: post-procedure vital signs reviewed and stable Respiratory status: spontaneous breathing, nonlabored ventilation and respiratory function stable Cardiovascular status: blood pressure returned to baseline and stable Postop Assessment: no apparent nausea or vomiting Anesthetic complications: no   No notable events documented.  Last Vitals:  Vitals:   02/01/21 0945 02/01/21 1000  BP: 134/84 134/77  Pulse: 87 90  Resp: 19 17  Temp:    SpO2: 97% 96%    Last Pain:  Vitals:   02/01/21 1024  TempSrc:   PainSc: 0-No pain                 Lucretia Kern

## 2021-02-01 NOTE — Op Note (Signed)
DATE OF PROCEDURE:  02/01/2021                              OPERATIVE REPORT  SURGEON:  Newman Pies, MD  PREOPERATIVE DIAGNOSES: 1. Adenotonsillar hypertrophy. 2. Chronic tonsillitis and pharyngitis  POSTOPERATIVE DIAGNOSES: 1. Adenotonsillar hypertrophy. 2. Chronic tonsillitis and pharyngitis  PROCEDURE PERFORMED:  Adenotonsillectomy.  ANESTHESIA:  General endotracheal tube anesthesia.  COMPLICATIONS:  None.  ESTIMATED BLOOD LOSS:  Minimal.  INDICATION FOR PROCEDURE:  Victoria Wu is a 25 y.o. female with a history of chronic tonsillitis/pharyngitis and halitosis.  According to the patient, she has been experiencing chronic throat discomfort with halitosis for 5+ years. The patient continued to be symptomatic despite medical treatments. On examination, the patient was noted to have bilateral cryptic tonsils, with numerous tonsilloliths. Based on the above findings, the decision was made for the patient to undergo the adenotonsillectomy procedure. Likelihood of success in reducing symptoms was also discussed.  The risks, benefits, alternatives, and details of the procedure were discussed with the patient.  Questions were invited and answered.  Informed consent was obtained.  DESCRIPTION:  The patient was taken to the operating room and placed supine on the operating table.  General endotracheal tube anesthesia was administered by the anesthesiologist.  The patient was positioned and prepped and draped in a standard fashion for adenotonsillectomy.  A Crowe-Davis mouth gag was inserted into the oral cavity for exposure. 2+ cryptic tonsils were noted bilaterally.  No bifidity was noted.  Indirect mirror examination of the nasopharynx revealed mild adenoid hypertrophy. The adenoid was ablated with the Coblator device. Hemostasis was achieved with the Coblator device.  The right tonsil was then grasped with a straight Allis clamp and retracted medially.  It was resected free from the underlying  pharyngeal constrictor muscles with the Coblator device.  The same procedure was repeated on the left side without exception.  The surgical sites were copiously irrigated.  The mouth gag was removed.  The care of the patient was turned over to the anesthesiologist.  The patient was awakened from anesthesia without difficulty.  The patient was extubated and transferred to the recovery room in good condition.  OPERATIVE FINDINGS:  Adenotonsillar hypertrophy.  SPECIMEN:  None.  FOLLOWUP CARE:  The patient will be discharged home once awake and alert.  She will be placed on azithromycin for 3 days, and percocet for postop pain control.   The patient will follow up in my office in approximately 2 weeks.  Teddie Curd W [name] 02/01/2021 9:29 AM

## 2021-02-01 NOTE — H&P (Signed)
Cc: Recurrent tonsillitis  HPI: The patient is a 25 y/o female who presents today for evaluation of recurrent tonsillitis. The patient has noted issues with her tonsils for most of her life. She had a lot of strep throat when she was younger. The patient continues to have frequent tonsillitis. The patient also complains of tonsil stones with associated halitosis. She feels her tonsils cause her to have frequent illnesses. The patient has been told in the past that her septum is deviated but she currently denies any significant nasal obstruction. Previous ENT surgery is denied.   The patient's review of systems (constitutional, eyes, ENT, cardiovascular, respiratory, GI, musculoskeletal, skin, neurologic, psychiatric, endocrine, hematologic, allergic) is noted in the ROS questionnaire.  It is reviewed with the patient.   Major events: Wisdom teeth extraction, bilateral rib  resection.  Ongoing medical problems: Depression, seasonal allergies.  Family health history: No HTN, DM, CAD, hearing loss or bleeding disorder.  Social history: The patient is single. She is a Archivist. She denies the use of tobacco, alcohol or illegal drugs.   Exam: General: Communicates without difficulty, well nourished, no acute distress. Head:  Normocephalic, no lesions or asymmetry. Eyes: PERRL, EOMI. No scleral icterus, conjunctivae clear.  Neuro: CN II exam reveals vision grossly intact.  No nystagmus at any point of gaze. Ears:  EAC normal without erythema AU.  TM intact without fluid and mobile AU. Nose: Moist, pink mucosa without lesions or mass. Left septal deviation. Mouth: Oral cavity clear and moist, no lesions, tonsils symmetric. Tonsils are 2+, cryptic. Tonsils with mild erythema. Neck: Full range of motion, no lymphadenopathy or masses.   Assessment  1.  The patient's history and physical exam findings are consistent with chronic tonsillitis/pharyngitis, tonsilloliths, and halitosis secondary to  adenotonsillar hypertrophy.  Plan  1. The treatment options include continuing conservative observation versus adenotonsillectomy.  Based on the patient's history and physical exam findings, the patient will likely benefit from having the tonsils and adenoid removed.  The risks, benefits, alternatives, and details of the procedure are extensively reviewed with the patient.  Questions are invited and answered.  2. The patient is interested in proceeding with the procedure.  We will schedule the procedure in accordance with the family schedule.

## 2021-02-03 ENCOUNTER — Encounter (HOSPITAL_BASED_OUTPATIENT_CLINIC_OR_DEPARTMENT_OTHER): Payer: Self-pay | Admitting: Otolaryngology

## 2021-02-04 NOTE — Progress Notes (Signed)
Left message stating courtesy call and if any questions or concerns please call the doctors office.  

## 2021-02-13 DIAGNOSIS — F411 Generalized anxiety disorder: Secondary | ICD-10-CM | POA: Diagnosis not present

## 2021-02-20 ENCOUNTER — Encounter (HOSPITAL_BASED_OUTPATIENT_CLINIC_OR_DEPARTMENT_OTHER): Payer: Self-pay

## 2021-02-20 ENCOUNTER — Other Ambulatory Visit: Payer: Self-pay

## 2021-02-20 ENCOUNTER — Ambulatory Visit (HOSPITAL_COMMUNITY): Payer: BC Managed Care – PPO | Admitting: Certified Registered Nurse Anesthetist

## 2021-02-20 ENCOUNTER — Ambulatory Visit: Admit: 2021-02-20 | Payer: BC Managed Care – PPO | Admitting: Surgery

## 2021-02-20 ENCOUNTER — Encounter (HOSPITAL_COMMUNITY)
Admission: EM | Disposition: A | Discharge status: Home / Self Care | Payer: Self-pay | Source: Home / Self Care | Attending: Emergency Medicine

## 2021-02-20 ENCOUNTER — Ambulatory Visit (HOSPITAL_BASED_OUTPATIENT_CLINIC_OR_DEPARTMENT_OTHER)
Admission: EM | Admit: 2021-02-20 | Discharge: 2021-02-20 | Disposition: A | Discharge status: Home / Self Care | Payer: BC Managed Care – PPO | Attending: Surgery | Admitting: Surgery

## 2021-02-20 ENCOUNTER — Emergency Department (HOSPITAL_BASED_OUTPATIENT_CLINIC_OR_DEPARTMENT_OTHER): Payer: BC Managed Care – PPO

## 2021-02-20 DIAGNOSIS — G540 Brachial plexus disorders: Secondary | ICD-10-CM | POA: Diagnosis not present

## 2021-02-20 DIAGNOSIS — Z20822 Contact with and (suspected) exposure to covid-19: Secondary | ICD-10-CM | POA: Diagnosis not present

## 2021-02-20 DIAGNOSIS — Z9089 Acquired absence of other organs: Secondary | ICD-10-CM | POA: Diagnosis not present

## 2021-02-20 DIAGNOSIS — Z791 Long term (current) use of non-steroidal anti-inflammatories (NSAID): Secondary | ICD-10-CM | POA: Diagnosis not present

## 2021-02-20 DIAGNOSIS — Z9889 Other specified postprocedural states: Secondary | ICD-10-CM | POA: Insufficient documentation

## 2021-02-20 DIAGNOSIS — K3580 Unspecified acute appendicitis: Secondary | ICD-10-CM | POA: Diagnosis not present

## 2021-02-20 DIAGNOSIS — R109 Unspecified abdominal pain: Secondary | ICD-10-CM | POA: Diagnosis not present

## 2021-02-20 DIAGNOSIS — K353 Acute appendicitis with localized peritonitis, without perforation or gangrene: Secondary | ICD-10-CM | POA: Insufficient documentation

## 2021-02-20 HISTORY — PX: LAPAROSCOPIC APPENDECTOMY: SHX408

## 2021-02-20 LAB — CBC WITH DIFFERENTIAL/PLATELET
Abs Immature Granulocytes: 0.04 10*3/uL (ref 0.00–0.07)
Basophils Absolute: 0 10*3/uL (ref 0.0–0.1)
Basophils Relative: 0 %
Eosinophils Absolute: 0.1 10*3/uL (ref 0.0–0.5)
Eosinophils Relative: 1 %
HCT: 38.5 % (ref 36.0–46.0)
Hemoglobin: 13.1 g/dL (ref 12.0–15.0)
Immature Granulocytes: 0 %
Lymphocytes Relative: 17 %
Lymphs Abs: 1.7 10*3/uL (ref 0.7–4.0)
MCH: 28.1 pg (ref 26.0–34.0)
MCHC: 34 g/dL (ref 30.0–36.0)
MCV: 82.4 fL (ref 80.0–100.0)
Monocytes Absolute: 1 10*3/uL (ref 0.1–1.0)
Monocytes Relative: 10 %
Neutro Abs: 7.2 10*3/uL (ref 1.7–7.7)
Neutrophils Relative %: 72 %
Platelets: 243 10*3/uL (ref 150–400)
RBC: 4.67 MIL/uL (ref 3.87–5.11)
RDW: 14 % (ref 11.5–15.5)
WBC: 10 10*3/uL (ref 4.0–10.5)
nRBC: 0 % (ref 0.0–0.2)

## 2021-02-20 LAB — COMPREHENSIVE METABOLIC PANEL
ALT: 40 U/L (ref 0–44)
AST: 32 U/L (ref 15–41)
Albumin: 4 g/dL (ref 3.5–5.0)
Alkaline Phosphatase: 69 U/L (ref 38–126)
Anion gap: 8 (ref 5–15)
BUN: 12 mg/dL (ref 6–20)
CO2: 25 mmol/L (ref 22–32)
Calcium: 9 mg/dL (ref 8.9–10.3)
Chloride: 104 mmol/L (ref 98–111)
Creatinine, Ser: 0.76 mg/dL (ref 0.44–1.00)
GFR, Estimated: >60 mL/min (ref >60)
Glucose, Bld: 90 mg/dL (ref 70–99)
Potassium: 3.6 mmol/L (ref 3.5–5.1)
Sodium: 137 mmol/L (ref 135–145)
Total Bilirubin: 0.2 mg/dL — ABNORMAL LOW (ref 0.3–1.2)
Total Protein: 7.1 g/dL (ref 6.5–8.1)

## 2021-02-20 LAB — URINALYSIS, ROUTINE W REFLEX MICROSCOPIC
Bilirubin Urine: NEGATIVE
Glucose, UA: NEGATIVE mg/dL
Hgb urine dipstick: NEGATIVE
Ketones, ur: NEGATIVE mg/dL
Leukocytes,Ua: NEGATIVE
Nitrite: NEGATIVE
Protein, ur: NEGATIVE mg/dL
Specific Gravity, Urine: 1.015 (ref 1.005–1.030)
pH: 6 (ref 5.0–8.0)

## 2021-02-20 LAB — PREGNANCY, URINE: Preg Test, Ur: NEGATIVE

## 2021-02-20 LAB — RESP PANEL BY RT-PCR (FLU A&B, COVID) ARPGX2
Influenza A by PCR: NEGATIVE
Influenza B by PCR: NEGATIVE
SARS Coronavirus 2 by RT PCR: NEGATIVE

## 2021-02-20 LAB — LIPASE, BLOOD: Lipase: 31 U/L (ref 11–51)

## 2021-02-20 SURGERY — APPENDECTOMY, LAPAROSCOPIC
Anesthesia: General

## 2021-02-20 MED ORDER — KETOROLAC TROMETHAMINE 30 MG/ML IJ SOLN
INTRAMUSCULAR | Status: DC | PRN
  Administered 2021-02-20: 30 mg via INTRAVENOUS

## 2021-02-20 MED ORDER — MORPHINE SULFATE (PF) 4 MG/ML IV SOLN: 4.0000 mg | INTRAVENOUS | Status: DC | PRN

## 2021-02-20 MED ORDER — SUCCINYLCHOLINE CHLORIDE 200 MG/10ML IV SOSY
PREFILLED_SYRINGE | INTRAVENOUS | Status: AC
  Filled 2021-02-20: qty 10

## 2021-02-20 MED ORDER — PROPOFOL 10 MG/ML IV BOLUS
INTRAVENOUS | Status: DC | PRN
  Administered 2021-02-20: 200 mg via INTRAVENOUS

## 2021-02-20 MED ORDER — KETOROLAC TROMETHAMINE 30 MG/ML IJ SOLN
INTRAMUSCULAR | Status: AC
  Filled 2021-02-20: qty 1

## 2021-02-20 MED ORDER — SUGAMMADEX SODIUM 200 MG/2ML IV SOLN
INTRAVENOUS | Status: DC | PRN
  Administered 2021-02-20: 200 mg via INTRAVENOUS

## 2021-02-20 MED ORDER — CHLORHEXIDINE GLUCONATE 0.12 % MT SOLN
15.0000 mL | OROMUCOSAL | Status: AC
  Administered 2021-02-20: 16:00:00 15 mL via OROMUCOSAL

## 2021-02-20 MED ORDER — FENTANYL CITRATE PF 50 MCG/ML IJ SOSY
PREFILLED_SYRINGE | INTRAMUSCULAR | Status: AC
  Filled 2021-02-20: qty 1

## 2021-02-20 MED ORDER — ROCURONIUM BROMIDE 10 MG/ML (PF) SYRINGE
PREFILLED_SYRINGE | INTRAVENOUS | Status: AC
  Filled 2021-02-20: qty 10

## 2021-02-20 MED ORDER — KETOROLAC TROMETHAMINE 30 MG/ML IJ SOLN: 30.0000 mg | Freq: Once | INTRAMUSCULAR | Status: DC

## 2021-02-20 MED ORDER — FENTANYL CITRATE PF 50 MCG/ML IJ SOSY
PREFILLED_SYRINGE | INTRAMUSCULAR | Status: AC
  Administered 2021-02-20: 18:00:00 25 ug via INTRAVENOUS
  Filled 2021-02-20: qty 1

## 2021-02-20 MED ORDER — ONDANSETRON HCL 4 MG/2ML IJ SOLN: 4.0000 mg | Freq: Four times a day (QID) | INTRAMUSCULAR | Status: DC | PRN

## 2021-02-20 MED ORDER — PROPOFOL 10 MG/ML IV BOLUS
INTRAVENOUS | Status: AC
  Filled 2021-02-20: qty 20

## 2021-02-20 MED ORDER — LIDOCAINE HCL (PF) 2 % IJ SOLN
INTRAMUSCULAR | Status: AC
  Filled 2021-02-20: qty 5

## 2021-02-20 MED ORDER — OXYCODONE HCL 5 MG PO TABS: 5.0000 mg | ORAL_TABLET | Freq: Four times a day (QID) | ORAL | 0 refills | Status: DC | PRN

## 2021-02-20 MED ORDER — OXYCODONE HCL 5 MG PO TABS
ORAL_TABLET | ORAL | Status: AC
  Administered 2021-02-20: 18:00:00 5 mg via ORAL
  Filled 2021-02-20: qty 1

## 2021-02-20 MED ORDER — BUPIVACAINE-EPINEPHRINE (PF) 0.25% -1:200000 IJ SOLN
INTRAMUSCULAR | Status: AC
  Filled 2021-02-20: qty 30

## 2021-02-20 MED ORDER — FENTANYL CITRATE PF 50 MCG/ML IJ SOSY
25.0000 ug | PREFILLED_SYRINGE | INTRAMUSCULAR | Status: DC | PRN
Start: 2021-02-20 — End: 2021-02-21
  Administered 2021-02-20 (×2): 25 ug via INTRAVENOUS

## 2021-02-20 MED ORDER — ROCURONIUM BROMIDE 10 MG/ML (PF) SYRINGE
PREFILLED_SYRINGE | INTRAVENOUS | Status: DC | PRN
  Administered 2021-02-20: 50 mg via INTRAVENOUS

## 2021-02-20 MED ORDER — ONDANSETRON HCL 4 MG/2ML IJ SOLN
INTRAMUSCULAR | Status: DC | PRN
  Administered 2021-02-20: 4 mg via INTRAVENOUS

## 2021-02-20 MED ORDER — FENTANYL CITRATE (PF) 100 MCG/2ML IJ SOLN
INTRAMUSCULAR | Status: AC
  Filled 2021-02-20: qty 2

## 2021-02-20 MED ORDER — DEXAMETHASONE SODIUM PHOSPHATE 10 MG/ML IJ SOLN
INTRAMUSCULAR | Status: DC | PRN
  Administered 2021-02-20: 5 mg via INTRAVENOUS

## 2021-02-20 MED ORDER — LACTATED RINGERS IV SOLN: INTRAVENOUS | Status: DC

## 2021-02-20 MED ORDER — BUPIVACAINE-EPINEPHRINE 0.25% -1:200000 IJ SOLN
INTRAMUSCULAR | Status: DC | PRN
  Administered 2021-02-20: 30 mL

## 2021-02-20 MED ORDER — AMISULPRIDE (ANTIEMETIC) 5 MG/2ML IV SOLN: 10.0000 mg | Freq: Once | INTRAVENOUS | Status: DC | PRN

## 2021-02-20 MED ORDER — ACETAMINOPHEN 10 MG/ML IV SOLN
1000.0000 mg | Freq: Once | INTRAVENOUS | Status: DC | PRN
  Administered 2021-02-20: 18:00:00 1000 mg via INTRAVENOUS

## 2021-02-20 MED ORDER — PROMETHAZINE HCL 25 MG/ML IJ SOLN
6.2500 mg | INTRAMUSCULAR | Status: DC | PRN
Start: 2021-02-20 — End: 2021-02-21

## 2021-02-20 MED ORDER — MIDAZOLAM HCL 2 MG/2ML IJ SOLN
INTRAMUSCULAR | Status: AC
  Filled 2021-02-20: qty 2

## 2021-02-20 MED ORDER — METRONIDAZOLE 500 MG/100ML IV SOLN
500.0000 mg | Freq: Once | INTRAVENOUS | Status: AC
  Administered 2021-02-20: 17:00:00 500 mg via INTRAVENOUS
  Filled 2021-02-20: qty 100

## 2021-02-20 MED ORDER — OXYCODONE HCL 5 MG/5ML PO SOLN: 5.0000 mg | Freq: Once | ORAL | Status: AC | PRN

## 2021-02-20 MED ORDER — SCOPOLAMINE 1 MG/3DAYS TD PT72
1.0000 | MEDICATED_PATCH | Freq: Once | TRANSDERMAL | Status: DC
  Administered 2021-02-20: 16:00:00 1.5 mg via TRANSDERMAL
  Filled 2021-02-20: qty 1

## 2021-02-20 MED ORDER — SODIUM CHLORIDE 0.9 % IV SOLN: INTRAVENOUS | Status: DC | PRN

## 2021-02-20 MED ORDER — FENTANYL CITRATE (PF) 250 MCG/5ML IJ SOLN
INTRAMUSCULAR | Status: DC | PRN
  Administered 2021-02-20 (×2): 50 ug via INTRAVENOUS

## 2021-02-20 MED ORDER — LIDOCAINE 2% (20 MG/ML) 5 ML SYRINGE
INTRAMUSCULAR | Status: DC | PRN
  Administered 2021-02-20: 80 mg via INTRAVENOUS

## 2021-02-20 MED ORDER — LACTATED RINGERS IR SOLN
Status: DC | PRN
  Administered 2021-02-20: 1000 mL

## 2021-02-20 MED ORDER — ACETAMINOPHEN 10 MG/ML IV SOLN
INTRAVENOUS | Status: AC
  Filled 2021-02-20: qty 100

## 2021-02-20 MED ORDER — MIDAZOLAM HCL 5 MG/5ML IJ SOLN
INTRAMUSCULAR | Status: DC | PRN
  Administered 2021-02-20: 2 mg via INTRAVENOUS

## 2021-02-20 MED ORDER — SODIUM CHLORIDE 0.9 % IV SOLN
2.0000 g | Freq: Once | INTRAVENOUS | Status: AC
  Administered 2021-02-20: 15:00:00 2 g via INTRAVENOUS
  Filled 2021-02-20: qty 20

## 2021-02-20 MED ORDER — IOHEXOL 300 MG/ML  SOLN
80.0000 mL | Freq: Once | INTRAMUSCULAR | Status: AC | PRN
  Administered 2021-02-20: 14:00:00 80 mL via INTRAVENOUS

## 2021-02-20 MED ORDER — OXYCODONE HCL 5 MG PO TABS: 5.0000 mg | ORAL_TABLET | Freq: Once | ORAL | Status: AC | PRN

## 2021-02-20 SURGICAL SUPPLY — 45 items
APL PRP STRL LF DISP 70% ISPRP (MISCELLANEOUS) ×1
APPLIER CLIP ROT 10 11.4 M/L (STAPLE)
BAG COUNTER SPONGE SURGICOUNT (BAG) IMPLANT
BAG SPEC RTRVL LRG 6X4 10 (ENDOMECHANICALS) ×1
BAG SPNG CNTER NS LX DISP (BAG)
BAG SURGICOUNT SPONGE COUNTING (BAG)
CHLORAPREP W/TINT 26 (MISCELLANEOUS) ×3 IMPLANT
CLIP APPLIE ROT 10 11.4 M/L (STAPLE) IMPLANT
CLOSURE WOUND 1/2 X4 (GAUZE/BANDAGES/DRESSINGS) ×1
COVER SURGICAL LIGHT HANDLE (MISCELLANEOUS) ×3 IMPLANT
CUTTER FLEX LINEAR 45M (STAPLE) ×2 IMPLANT
DECANTER SPIKE VIAL GLASS SM (MISCELLANEOUS) ×1 IMPLANT
DERMABOND ADVANCED (GAUZE/BANDAGES/DRESSINGS) ×2
DERMABOND ADVANCED .7 DNX12 (GAUZE/BANDAGES/DRESSINGS) ×1 IMPLANT
DRAPE LAPAROSCOPIC ABDOMINAL (DRAPES) ×3 IMPLANT
ELECT PENCIL ROCKER SW 15FT (MISCELLANEOUS) IMPLANT
ELECT REM PT RETURN 15FT ADLT (MISCELLANEOUS) ×3 IMPLANT
ENDOLOOP SUT PDS II  0 18 (SUTURE)
ENDOLOOP SUT PDS II 0 18 (SUTURE) IMPLANT
GLOVE SURG ORTHO LTX SZ8 (GLOVE) ×3 IMPLANT
GLOVE SURG SYN 7.5  E (GLOVE) ×3
GLOVE SURG SYN 7.5 E (GLOVE) ×1 IMPLANT
GLOVE SURG SYN 7.5 PF PI (GLOVE) ×1 IMPLANT
GOWN STRL REUS W/TWL XL LVL3 (GOWN DISPOSABLE) ×6 IMPLANT
IRRIG SUCT STRYKERFLOW 2 WTIP (MISCELLANEOUS) ×3
IRRIGATION SUCT STRKRFLW 2 WTP (MISCELLANEOUS) ×1 IMPLANT
KIT BASIN OR (CUSTOM PROCEDURE TRAY) ×3 IMPLANT
KIT TURNOVER KIT A (KITS) ×2 IMPLANT
POUCH SPECIMEN RETRIEVAL 10MM (ENDOMECHANICALS) ×3 IMPLANT
RELOAD 45 VASCULAR/THIN (ENDOMECHANICALS) IMPLANT
RELOAD STAPLE 45 2.5 WHT GRN (ENDOMECHANICALS) IMPLANT
RELOAD STAPLE 45 3.5 BLU ETS (ENDOMECHANICALS) IMPLANT
RELOAD STAPLE TA45 3.5 REG BLU (ENDOMECHANICALS) ×3 IMPLANT
SET TUBE SMOKE EVAC HIGH FLOW (TUBING) ×3 IMPLANT
SHEARS HARMONIC ACE PLUS 36CM (ENDOMECHANICALS) ×3 IMPLANT
STRIP CLOSURE SKIN 1/2X4 (GAUZE/BANDAGES/DRESSINGS) ×2 IMPLANT
SUT MNCRL AB 4-0 PS2 18 (SUTURE) ×3 IMPLANT
TOWEL OR 17X26 10 PK STRL BLUE (TOWEL DISPOSABLE) ×3 IMPLANT
TOWEL OR NON WOVEN STRL DISP B (DISPOSABLE) ×3 IMPLANT
TRAY FOLEY MTR SLVR 14FR STAT (SET/KITS/TRAYS/PACK) IMPLANT
TRAY FOLEY MTR SLVR 16FR STAT (SET/KITS/TRAYS/PACK) IMPLANT
TRAY LAPAROSCOPIC (CUSTOM PROCEDURE TRAY) ×3 IMPLANT
TROCAR BLADELESS OPT 5 100 (ENDOMECHANICALS) ×3 IMPLANT
TROCAR XCEL BLUNT TIP 100MML (ENDOMECHANICALS) ×3 IMPLANT
TROCAR XCEL NON-BLD 11X100MML (ENDOMECHANICALS) ×3 IMPLANT

## 2021-02-20 NOTE — ED Notes (Signed)
Pts PIV secured with coban, pt provided with paperwork for admission, given directions to get to [facility], informed to go directly to [facility] and to not eat or drink anything prior to her arrival there.  No questions or concerns at time of transfer. Pt reports she feels comfortable to ambulate out of ED with parent.

## 2021-02-20 NOTE — Anesthesia Preprocedure Evaluation (Addendum)
Anesthesia Evaluation  Patient identified by MRN, date of birth, ID band Patient awake    Reviewed: Allergy & Precautions, NPO status , Patient's Chart, lab work & pertinent test results  History of Anesthesia Complications (+) PONV and history of anesthetic complications  Airway Mallampati: I  TM Distance: >3 FB Neck ROM: Full    Dental no notable dental hx.    Pulmonary neg pulmonary ROS,    Pulmonary exam normal breath sounds clear to auscultation       Cardiovascular negative cardio ROS Normal cardiovascular exam Rhythm:Regular Rate:Normal     Neuro/Psych negative neurological ROS  negative psych ROS   GI/Hepatic negative GI ROS, Neg liver ROS,   Endo/Other  negative endocrine ROS  Renal/GU negative Renal ROS     Musculoskeletal negative musculoskeletal ROS (+)   Abdominal   Peds  Hematology negative hematology ROS (+)   Anesthesia Other Findings acute appendicitis  Reproductive/Obstetrics hcg negative                            Anesthesia Physical Anesthesia Plan  ASA: 1 and emergent  Anesthesia Plan: General   Post-op Pain Management:    Induction: Intravenous  PONV Risk Score and Plan: 4 or greater and Midazolam, Scopolamine patch - Pre-op, Ondansetron, Dexamethasone and Treatment may vary due to age or medical condition  Airway Management Planned: Oral ETT  Additional Equipment:   Intra-op Plan:   Post-operative Plan: Extubation in OR  Informed Consent: I have reviewed the patients History and Physical, chart, labs and discussed the procedure including the risks, benefits and alternatives for the proposed anesthesia with the patient or authorized representative who has indicated his/her understanding and acceptance.     Dental advisory given  Plan Discussed with: CRNA  Anesthesia Plan Comments:        Anesthesia Quick Evaluation

## 2021-02-20 NOTE — ED Notes (Signed)
ED Provider at bedside. 

## 2021-02-20 NOTE — ED Provider Notes (Signed)
[facility] EMERGENCY DEPARTMENT Provider Note   CSN: [id number] Arrival date & time: 02/20/21  1139     History Chief Complaint  Patient presents with   Abdominal Pain    Victoria Wu is a 25 y.o. female.  25 year old female presents with complaint of right lower quadrant abdominal pain.  Right lower quadrant abdominal pain is described as sharp in nature, does not radiate, is worse with movement.  Reports onset of pain with diarrhea Monday night.  Did not eat much yesterday due to concern for return of the diarrhea.  Onset of vomiting today.  Denies fevers or chills however states that she is on round-the-clock Motrin since removal of her tonsils at the beginning the month.  Denies changes in bladder habits.  Reports diarrhea to be loose stools, nonbloody.  Emesis nonbloody as well.  No prior abdominal surgeries.      Past Medical History:  Diagnosis Date   Enlarged tonsils and adenoids    PONV (postoperative nausea and vomiting)    Thoracic outlet syndrome     Patient Active Problem List   Diagnosis Date Noted   Numbness and tingling 04/12/2013   Thoracic outlet syndrome 04/12/2013    Past Surgical History:  Procedure Laterality Date   RIB RESECTION     TONSILLECTOMY AND ADENOIDECTOMY N/A 02/01/2021   Procedure: TONSILLECTOMY AND POSSIBLE ADENOIDECTOMY;  Surgeon: Newman Pies, MD;  Location: [facility];  Service: ENT;  Laterality: N/A;   WISDOM TOOTH EXTRACTION       OB History   No obstetric history on file.     No family history on file.  Social History   Tobacco Use   Smoking status: Never   Smokeless tobacco: Never  Vaping Use   Vaping Use: Never used  Substance Use Topics   Alcohol use: Yes    Comment: occ   Drug use: No    Home Medications Prior to Admission medications   Medication Sig Start Date End Date Taking? Authorizing Provider  acetaminophen (TYLENOL) 500 MG tablet Take 1,000 mg by mouth every 6 (six) hours as  needed for mild pain or moderate pain.    [provider]  buPROPion (WELLBUTRIN XL) 300 MG 24 hr tablet Take 300 mg by mouth daily.    [provider]  ibuprofen (ADVIL,MOTRIN) 200 MG tablet Take 600 mg by mouth every 6 (six) hours as needed for mild pain or moderate pain.    [provider]  norethindrone-ethinyl estradiol-FE (LOESTRIN FE) 1-20 MG-MCG tablet Take 1 tablet by mouth daily.    [provider]    Allergies    Patient has no known allergies.  Review of Systems   Review of Systems  Constitutional:  Positive for appetite change. Negative for chills and fever.  Respiratory:  Negative for shortness of breath.   Cardiovascular:  Negative for chest pain.  Gastrointestinal:  Positive for abdominal pain, diarrhea, nausea and vomiting. Negative for blood in stool and constipation.  Genitourinary:  Negative for dysuria and frequency.  Musculoskeletal:  Negative for arthralgias and myalgias.  Skin:  Negative for rash and wound.  Allergic/Immunologic: Negative for immunocompromised state.  Neurological:  Negative for weakness.  Hematological:  Negative for adenopathy.  Psychiatric/Behavioral:  Negative for confusion.   All other systems reviewed and are negative.  Physical Exam Updated Vital Signs BP 118/62    Pulse 94    Temp 98 F (36.7 C) (Oral)    Resp 15  SpO2 100%   Physical Exam Vitals and nursing note reviewed.  Constitutional:      General: She is not in acute distress.    Appearance: She is well-developed. She is not diaphoretic.  HENT:     Head: Normocephalic and atraumatic.  Cardiovascular:     Rate and Rhythm: Normal rate and regular rhythm.     Heart sounds: Normal heart sounds.  Pulmonary:     Effort: Pulmonary effort is normal.     Breath sounds: Normal breath sounds.  Abdominal:     Palpations: Abdomen is soft.     Tenderness: There is abdominal tenderness in the right lower quadrant. There is rebound. There is no  right CVA tenderness or left CVA tenderness. Positive signs include psoas sign and obturator sign.  Skin:    General: Skin is warm and dry.     Findings: No erythema or rash.  Neurological:     Mental Status: She is alert and oriented to person, place, and time.  Psychiatric:        Behavior: Behavior normal.    ED Results / Procedures / Treatments   Labs (all labs ordered are listed, but only abnormal results are displayed) Labs Reviewed  COMPREHENSIVE METABOLIC PANEL - Abnormal; Notable for the following components:      Result Value   Total Bilirubin 0.2 (*)    All other components within normal limits  RESP PANEL BY RT-PCR (FLU A&B, COVID) ARPGX2  CBC WITH DIFFERENTIAL/PLATELET  LIPASE, BLOOD  URINALYSIS, ROUTINE W REFLEX MICROSCOPIC  PREGNANCY, URINE    EKG None  Radiology CT Abdomen Pelvis W Contrast  Result Date: 02/20/2021 CLINICAL DATA:  RLQ abdominal pain (Age >= 14y) EXAM: CT ABDOMEN AND PELVIS WITH CONTRAST TECHNIQUE: Multidetector CT imaging of the abdomen and pelvis was performed using the standard protocol following bolus administration of intravenous contrast. CONTRAST:  82mL OMNIPAQUE IOHEXOL 300 MG/ML  SOLN COMPARISON:  None. FINDINGS: Lower chest: No acute abnormality. Hepatobiliary: No focal liver abnormality is seen. No gallstones, gallbladder wall thickening, or biliary dilatation. Pancreas: Unremarkable. Spleen: Unremarkable. Adrenals/Urinary Tract: Adrenals, kidneys, and distended bladder are unremarkable. Stomach/Bowel: Stomach is within normal limits. Bowel is normal in caliber. Appendix is mildly dilated with surrounding inflammatory changes. Vascular/Lymphatic: No significant vascular abnormality. No enlarged nodes. Reproductive: Uterus and bilateral adnexa are unremarkable. Other: Trace free fluid in the pelvis. Abdominal wall is unremarkable. Musculoskeletal: No acute abnormality. IMPRESSION: Acute appendicitis without complication. These results were  called by telephone at the time of interpretation on 02/20/2021 at 2:13 pm to provider [facility] , who verbally acknowledged these results. Electronically Signed   By: Guadlupe Spanish M.D.   On: 02/20/2021 14:15    Procedures Procedures   Medications Ordered in ED Medications  morphine 4 MG/ML injection 4 mg (has no administration in time range)  ondansetron (ZOFRAN) injection 4 mg (has no administration in time range)  cefTRIAXone (ROCEPHIN) 2 g in sodium chloride 0.9 % 100 mL IVPB (has no administration in time range)    And  metroNIDAZOLE (FLAGYL) IVPB 500 mg (has no administration in time range)  iohexol (OMNIPAQUE) 300 MG/ML solution 80 mL (80 mLs Intravenous Contrast Given 02/20/21 1355)    ED Course  I have reviewed the triage vital signs and the nursing notes.  Pertinent labs & imaging results that were available during my care of the patient were reviewed by me and considered in my medical decision making (see chart for details).  Clinical Course as  of 02/20/21 1432  Wed Feb 20, 2021  5171 25 year old female with right lower quadrant pain, on exam found to have tenderness to the right lower quadrant with positive psoas and obturator signs, concerning for acute appendicitis. Vitals reviewed, patient is afebrile with O2 sat 100% on room air.  CBC with normal white blood cell count, normal chemistry panel, normal urinalysis and negative pregnancy test.  Lipase within normal meds.  CT abdomen pelvis obtained with call from radiology confirming acute appendicitis without perforation. Case discussed with Dr. Gerrit Friends on-call with general surgery who accepts patient in transfer to [facility] short stay for appendectomy. Results discussed with patient and her father at bedside.  Patient's father is comfortable taking patient by private vehicle to [facility].  Patient will be given antibiotics prior to transfer. [LM]    Clinical Course User Index [LM] Alden Hipp    MDM Rules/Calculators/A&P                            Final Clinical Impression(s) / ED Diagnoses Final diagnoses:  Acute appendicitis with localized peritonitis, without perforation, abscess, or gangrene    Rx / DC Orders ED Discharge Orders     None        Jeannie Fend, PA-C 02/20/21 1433    Rolan Bucco, MD 02/20/21 1537

## 2021-02-20 NOTE — Anesthesia Procedure Notes (Signed)
Procedure Name: Intubation Date/Time: 02/20/2021 4:44 PM Performed by: Chonita Gadea D, CRNA Pre-anesthesia Checklist: Patient identified, Emergency Drugs available, Suction available and Patient being monitored Patient Re-evaluated:Patient Re-evaluated prior to induction Oxygen Delivery Method: Circle system utilized Preoxygenation: Pre-oxygenation with 100% oxygen Induction Type: IV induction Ventilation: Mask ventilation without difficulty Laryngoscope Size: Mac and 3 Grade View: Grade I Tube type: Oral Tube size: 7.0 mm Number of attempts: 1 Airway Equipment and Method: Stylet and Oral airway Placement Confirmation: ETT inserted through vocal cords under direct vision, positive ETCO2 and breath sounds checked- equal and bilateral Secured at: 22 cm Tube secured with: Tape Dental Injury: Teeth and Oropharynx as per pre-operative assessment

## 2021-02-20 NOTE — ED Notes (Signed)
Pt declines need for pain or nausea medication at this time

## 2021-02-20 NOTE — ED Notes (Signed)
Pt ambulatory with steady gait to restroom 

## 2021-02-20 NOTE — ED Triage Notes (Signed)
Pt c/o abd pain, decreased appetite, diarrhea, n/v-sx started 3 days ago-NAD-steady gait

## 2021-02-20 NOTE — Interval H&P Note (Signed)
History and Physical Interval Note:  02/20/2021 4:25 PM  Victoria Wu  has presented today for surgery, with the diagnosis of acute appendicitis.  The various methods of treatment have been discussed with the patient and family. After consideration of risks, benefits and other options for treatment, the patient has consented to    Procedure(s): APPENDECTOMY LAPAROSCOPIC (N/A) as a surgical intervention.    The patient's history has been reviewed, patient examined, no change in status, stable for surgery.  I have reviewed the patient's chart and labs.  Questions were answered to the patient's satisfaction.    Darnell Level, MD [facility] practice Office: [phone]   Darnell Level

## 2021-02-20 NOTE — ED Notes (Signed)
Patient transported to CT 

## 2021-02-20 NOTE — H&P (Signed)
H&P Note  Victoria Wu 11/01/1995  [id number].    Requesting MD: Army Melia PA-C Chief Complaint/Reason for Consult: Acute appendicitis   HPI:  Patient is a 25 year old female who presented to [facility] with RLQ abdominal pain. Pain started Monday night and is sharp and non-radiating. Pain is worsened by movement. Associated diarrhea and vomiting. Denies fever, chills, chest pain, SOB, urinary symptoms. PMH significant for hx of thoracic outlet syndrome s/p rib resection and recent tonsilectomy earlier this month. She has been on Motrin around the clock secondary to recent surgery. NKDA. No prior abdominal surgeries. Patient reports occasional alcohol use and denies tobacco or illicit drug use. 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   ROS: Review of Systems  Constitutional:  Negative for chills and fever.  Respiratory:  Negative for shortness of breath and wheezing.   Cardiovascular:  Negative for chest pain and palpitations.  Gastrointestinal:  Positive for abdominal pain, diarrhea, nausea and vomiting. Negative for blood in stool and melena.  Genitourinary:  Negative for dysuria, frequency and urgency.  All other systems reviewed and are negative.  No family history on file.  Past Medical History:  Diagnosis Date   Enlarged tonsils and adenoids    PONV (postoperative nausea and vomiting)    Thoracic outlet syndrome     Past Surgical History:  Procedure Laterality Date   RIB RESECTION     TONSILLECTOMY AND ADENOIDECTOMY N/A 02/01/2021   Procedure: TONSILLECTOMY AND POSSIBLE ADENOIDECTOMY;  Surgeon: Newman Pies, MD;  Location:  SURGERY CENTER;  Service: ENT;  Laterality: N/A;   WISDOM TOOTH EXTRACTION      Social History:  reports that she has never smoked. She has never used smokeless tobacco. She reports current alcohol use. She reports that she does not use drugs.  Allergies: No Known Allergies  (Not in a hospital  admission)   Blood pressure 118/62, pulse 94, temperature 98 F (36.7 C), temperature source Oral, resp. rate 15, SpO2 100 %. Physical Exam:  General: pleasant, WD, thin female who is laying in bed in NAD HEENT: head is normocephalic, atraumatic.  Sclera are anicteric. Pupils equal and round.  Ears and nose without any masses or lesions.  Mouth is pink and moist Heart: regular, rate, and rhythm.  Normal s1,s2. No obvious murmurs, gallops, or rubs noted.  Palpable radial and pedal pulses bilaterally Lungs: CTAB, no wheezes, rhonchi, or rales noted.  Respiratory effort nonlabored Abd: soft, ttp in RLQ without peritonitis, ND, +BS, no masses, hernias, or organomegaly MS: all 4 extremities are symmetrical with no cyanosis, clubbing, or edema. Skin: warm and dry with no masses, lesions, or rashes Neuro: Cranial nerves 2-12 grossly intact, sensation is normal throughout Psych: A&Ox3 with an appropriate affect.   Results for orders placed or performed during the hospital encounter of 02/20/21 (from the past 48 hour(s))  CBC with Differential     Status: None   Collection Time: 02/20/21 12:29 PM  Result Value Ref Range   WBC 10.0 4.0 - 10.5 K/uL   RBC 4.67 3.87 - 5.11 MIL/uL   Hemoglobin 13.1 12.0 - 15.0 g/dL   HCT 39.5 32.0 - 23.3 %   MCV 82.4 80.0 - 100.0 fL   MCH 28.1 26.0 - 34.0 pg   MCHC 34.0 30.0 - 36.0 g/dL   RDW 43.5 68.6 - 16.8 %   Platelets 243 150 - 400 K/uL   nRBC 0.0 0.0 -  0.2 %   Neutrophils Relative % 72 %   Neutro Abs 7.2 1.7 - 7.7 K/uL   Lymphocytes Relative 17 %   Lymphs Abs 1.7 0.7 - 4.0 K/uL   Monocytes Relative 10 %   Monocytes Absolute 1.0 0.1 - 1.0 K/uL   Eosinophils Relative 1 %   Eosinophils Absolute 0.1 0.0 - 0.5 K/uL   Basophils Relative 0 %   Basophils Absolute 0.0 0.0 - 0.1 K/uL   Immature Granulocytes 0 %   Abs Immature Granulocytes 0.04 0.00 - 0.07 K/uL    Comment: Performed at [facility], [address] [place], [place] [address]   Comprehensive metabolic panel     Status: Abnormal   Collection Time: 02/20/21 12:29 PM  Result Value Ref Range   Sodium 137 135 - 145 mmol/L   Potassium 3.6 3.5 - 5.1 mmol/L   Chloride 104 98 - 111 mmol/L   CO2 25 22 - 32 mmol/L   Glucose, Bld 90 70 - 99 mg/dL    Comment: Glucose reference range applies only to samples taken after fasting for at least 8 hours.   BUN 12 6 - 20 mg/dL   Creatinine, Ser 6.78 0.44 - 1.00 mg/dL   Calcium 9.0 8.9 - 93.8 mg/dL   Total Protein 7.1 6.5 - 8.1 g/dL   Albumin 4.0 3.5 - 5.0 g/dL   AST 32 15 - 41 U/L   ALT 40 0 - 44 U/L   Alkaline Phosphatase 69 38 - 126 U/L   Total Bilirubin 0.2 (L) 0.3 - 1.2 mg/dL   GFR, Estimated >10 >17 mL/min    Comment: (NOTE) Calculated using the CKD-EPI Creatinine Equation (2021)    Anion gap 8 5 - 15    Comment: Performed at [facility], [address] [place], [place] [address]  Lipase, blood     Status: None   Collection Time: 02/20/21 12:29 PM  Result Value Ref Range   Lipase 31 11 - 51 U/L    Comment: Performed at [facility], [address] [place], [place] [address]  Urinalysis, Routine w reflex microscopic     Status: None   Collection Time: 02/20/21 12:29 PM  Result Value Ref Range   Color, Urine YELLOW YELLOW   APPearance CLEAR CLEAR   Specific Gravity, Urine 1.015 1.005 - 1.030   pH 6.0 5.0 - 8.0   Glucose, UA NEGATIVE NEGATIVE mg/dL   Hgb urine dipstick NEGATIVE NEGATIVE   Bilirubin Urine NEGATIVE NEGATIVE   Ketones, ur NEGATIVE NEGATIVE mg/dL   Protein, ur NEGATIVE NEGATIVE mg/dL   Nitrite NEGATIVE NEGATIVE   Leukocytes,Ua NEGATIVE NEGATIVE    Comment: Microscopic not done on urines with negative protein, blood, leukocytes, nitrite, or glucose < 500 mg/dL. Performed at [facility], [address] [place], [place] [address]   Pregnancy, urine     Status: None   Collection Time: 02/20/21 12:29 PM  Result Value Ref Range   Preg Test, Ur NEGATIVE NEGATIVE     Comment:        THE SENSITIVITY OF THIS METHODOLOGY IS >20 mIU/mL. Performed at [facility], [address] [place], [place] [address]    CT Abdomen Pelvis W Contrast  Result Date: 02/20/2021 CLINICAL DATA:  RLQ abdominal pain (Age >= 14y) EXAM: CT ABDOMEN AND PELVIS WITH CONTRAST TECHNIQUE: Multidetector CT imaging of the abdomen and pelvis was performed using the standard protocol  following bolus administration of intravenous contrast. CONTRAST:  33mL OMNIPAQUE IOHEXOL 300 MG/ML  SOLN COMPARISON:  None. FINDINGS: Lower chest: No acute abnormality. Hepatobiliary: No focal liver abnormality is seen. No gallstones, gallbladder wall thickening, or biliary dilatation. Pancreas: Unremarkable. Spleen: Unremarkable. Adrenals/Urinary Tract: Adrenals, kidneys, and distended bladder are unremarkable. Stomach/Bowel: Stomach is within normal limits. Bowel is normal in caliber. Appendix is mildly dilated with surrounding inflammatory changes. Vascular/Lymphatic: No significant vascular abnormality. No enlarged nodes. Reproductive: Uterus and bilateral adnexa are unremarkable. Other: Trace free fluid in the pelvis. Abdominal wall is unremarkable. Musculoskeletal: No acute abnormality. IMPRESSION: Acute appendicitis without complication. These results were called by telephone at the time of interpretation on 02/20/2021 at 2:13 pm to provider [facility] , who verbally acknowledged these results. Electronically Signed   By: Guadlupe Spanish M.D.   On: 02/20/2021 14:15      Assessment/Plan Acute appendicitis  - CT today with mildly dilated appendix and surrounding inflammatory changes - WBC 10 and patient afebrile  - abdominal exam and hx consistent with acute appendicitis  - to OR for laparoscopic appendectomy, possible discharge from PACU pending intra-op findings. If unable to discharge from PACU will admit to observation.   FEN: NPO, IVF VTE: SCDs ID: [email or web address]  Hx of thoracic outlet  syndrome s/p rib resection  Recent tonsillectomy  Juliet Rude, [facility] Surgery 02/20/2021, 2:36 PM Please see Amion for pager number during day hours 7:00am-4:30pm

## 2021-02-20 NOTE — Transfer of Care (Signed)
Immediate Anesthesia Transfer of Care Note  Patient: Victoria Wu  Procedure(s) Performed: APPENDECTOMY LAPAROSCOPIC  Patient Location: PACU  Anesthesia Type:General  Level of Consciousness: awake  Airway & Oxygen Therapy: Patient Spontanous Breathing  Post-op Assessment: Report given to RN and Post -op Vital signs reviewed and stable  Post vital signs: Reviewed and stable  Last Vitals:  Vitals Value Taken Time  BP 128/80 02/20/21 1741  Temp    Pulse 91 02/20/21 1743  Resp 17 02/20/21 1743  SpO2 100 % 02/20/21 1743  Vitals shown include unvalidated device data.  Last Pain:  Vitals:   02/20/21 1620  TempSrc:   PainSc: 5          Complications: No notable events documented.

## 2021-02-20 NOTE — Op Note (Signed)
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  Patient developed abdominal pain.  WBC 10K.  CT consistent with acute appendicitis.  For appendectomy.  Procedure:  Patient was brought to the operating room and placed in a supine position on the operating room table. Following administration of general anesthesia, a time out was held and the patient's name and procedure was confirmed. Patient was then prepped and draped in the usual strict aseptic fashion.  After ascertaining that an adequate level of anesthesia had been achieved, a peri-umbilical incision was made with a #15 blade. Dissection was carried down to the fascia. Fascia was incised in the midline and the peritoneal cavity was entered cautiously. A #0-vicryl pursestring suture was placed in the fascia. An Hassan cannula was introduced under direct vision and secured with the pursestring suture. The abdomen was insufflated with carbon dioxide. The laparoscope was introduced and the abdomen was explored. Operative ports were placed in the right upper quadrant and left lower quadrant.  The cecum was mobilized.  There was an acutely inflamed appendix tracking posterior lateral to the cecum.  It was not retrocecal.  The tip was identified and elevated.  There was no sign of perforation.  Mesoappendix was divided with the harmonic scalpel.  Dissection was carried down to the base of the appendix.  The base of the appendix was transected with an Endo GIA stapler.  Good hemostasis was noted along the staple line.  The appendix was placed into an endo-catch bag and withdrawn through the umbilical port. The  #0-vicryl pursestring suture was tied securely.  Right lower quadrant was irrigated with warm saline which was evacuated. Good hemostasis was noted. Ports were removed under direct vision. Good hemostasis was noted at the port sites. Pneumoperitoneum was released.  Skin incisions were anesthetized with local anesthetic. Wounds were closed with interrupted 4-0 Monocryl subcuticular sutures. Wounds were washed and dried and Dermabond was applied. The patient was awakened from anesthesia and brought to the recovery room. The patient tolerated the procedure well.  Darnell Level, MD [facility] Surgery Office: [phone]

## 2021-02-20 NOTE — Discharge Instructions (Addendum)
[facility] LAPAROSCOPIC SURGERY: POST OP INSTRUCTIONS Always review your discharge instruction sheet given to you by the facility where your surgery was performed. IF YOU HAVE DISABILITY OR FAMILY LEAVE FORMS, YOU MUST BRING THEM TO THE OFFICE FOR PROCESSING.   DO NOT GIVE THEM TO YOUR DOCTOR.  PAIN CONTROL  First take acetaminophen (Tylenol) AND/or ibuprofen (Advil) to control your pain after surgery.  Follow directions on package.  Taking acetaminophen (Tylenol) and/or ibuprofen (Advil) regularly after surgery will help to control your pain and lower the amount of prescription pain medication you may need.  You should not take more than 3,000 mg (3 grams) of acetaminophen (Tylenol) in 24 hours.  You should not take ibuprofen (Advil), aleve, motrin, naprosyn or other NSAIDS if you have a history of stomach ulcers or chronic kidney disease.  A prescription for pain medication may be given to you upon discharge.  Take your pain medication as prescribed, if you still have uncontrolled pain after taking acetaminophen (Tylenol) or ibuprofen (Advil). Use ice packs to help control pain. If you need a refill on your pain medication, please contact your pharmacy.  They will contact our office to request authorization. Prescriptions will not be filled after 5pm or on week-ends.  HOME MEDICATIONS Take your usually prescribed medications unless otherwise directed.  DIET You should follow a light diet the first few days after arrival home.  Be sure to include lots of fluids daily. Avoid fatty, fried foods.   CONSTIPATION It is common to experience some constipation after surgery and if you are taking pain medication.  Increasing fluid intake and taking a stool softener (such as Colace) will usually help or prevent this problem from occurring.  A mild laxative (Milk of Magnesia or Miralax) should be taken according to package instructions if there are no bowel movements after 48  hours.  WOUND/INCISION CARE Most patients will experience some swelling and bruising in the area of the incisions.  Ice packs will help.  Swelling and bruising can take several days to resolve.  Unless discharge instructions indicate otherwise, follow guidelines below  STERI-STRIPS - you may remove your outer bandages 48 hours after surgery, and you may shower at that time.  You have steri-strips (small skin tapes) in place directly over the incision.  These strips should be left on the skin for 7-10 days.   DERMABOND/SKIN GLUE - you may shower in 24 hours.  The glue will flake off over the next 2-3 weeks. Any sutures or staples will be removed at the office during your follow-up visit.  ACTIVITIES You may resume regular (light) daily activities beginning the next day--such as daily self-care, walking, climbing stairs--gradually increasing activities as tolerated.  You may have sexual intercourse when it is comfortable.  Refrain from any heavy lifting or straining until approved by your doctor. You may drive when you are no longer taking prescription pain medication, you can comfortably wear a seatbelt, and you can safely maneuver your car and apply brakes.  FOLLOW-UP You should see your doctor in the office for a follow-up appointment approximately 2-3 weeks after your surgery.  You should have been given your post-op/follow-up appointment when your surgery was scheduled.  If you did not receive a post-op/follow-up appointment, make sure that you call for this appointment within a day or two after you arrive home to insure a convenient appointment time.   WHEN TO CALL YOUR DOCTOR: Fever over 101.0 Inability to urinate Continued bleeding from incision.   Increased pain, redness, or drainage from the incision. Increasing abdominal pain  The clinic staff is available to answer your questions during regular business hours.  Please don't hesitate to call and ask to speak to one of the nurses for  clinical concerns.  If you have a medical emergency, go to the nearest emergency room or call 911.  A surgeon from [facility] is always on call at the hospital. [address], [address], [place], [place]  [address] ? [address] [address], , [place]   [address] [phone] ? [phone] ? FAX [phone] Web site: [email or web address]      Managing Your Pain After Surgery Without Opioids    Thank you for participating in our program to help patients manage their pain after surgery without opioids. This is part of our effort to provide you with the best care possible, without exposing you or your family to the risk that opioids pose.  What pain can I expect after surgery? You can expect to have some pain after surgery. This is normal. The pain is typically worse the day after surgery, and quickly begins to get better. Many studies have found that many patients are able to manage their pain after surgery with Over-the-Counter (OTC) medications such as Tylenol and Motrin. If you have a condition that does not allow you to take Tylenol or Motrin, notify your surgical team.  How will I manage my pain? The best strategy for controlling your pain after surgery is around the clock pain control with Tylenol (acetaminophen) and Motrin (ibuprofen or Advil). Alternating these medications with each other allows you to maximize your pain control. In addition to Tylenol and Motrin, you can use heating pads or ice packs on your incisions to help reduce your pain.  How will I alternate your regular strength over-the-counter pain medication? You will take a dose of pain medication every three hours. Start by taking 650 mg of Tylenol (2 pills of 325 mg) 3 hours later take 600 mg of Motrin (3 pills of 200 mg) 3 hours after taking the Motrin take 650 mg of Tylenol 3 hours after that take 600 mg of Motrin.   - 1 -  See example - if your first dose of Tylenol is at 12:00  PM   12:00 PM Tylenol 650 mg (2 pills of 325 mg)  3:00 PM Motrin 600 mg (3 pills of 200 mg)  6:00 PM Tylenol 650 mg (2 pills of 325 mg)  9:00 PM Motrin 600 mg (3 pills of 200 mg)  Continue alternating every 3 hours   We recommend that you follow this schedule around-the-clock for at least 3 days after surgery, or until you feel that it is no longer needed. Use the table on the last page of this handout to keep track of the medications you are taking. Important: Do not take more than 3000mg of Tylenol or 3200mg of Motrin in a 24-hour period. Do not take ibuprofen/Motrin if you have a history of bleeding stomach ulcers, severe kidney disease, &/or actively taking a blood thinner  What if I still have pain? If you have pain that is not controlled with the over-the-counter pain medications (Tylenol and Motrin or Advil) you might have what we call "breakthrough" pain. You will receive a prescription for a small amount of an opioid pain medication such as Oxycodone, Tramadol, or Tylenol with Codeine. Use these opioid pills in the first 24 hours after surgery if you have breakthrough pain. Do   not take more than 1 pill every 4-6 hours.  If you still have uncontrolled pain after using all opioid pills, don't hesitate to call our staff using the number provided. We will help make sure you are managing your pain in the best way possible, and if necessary, we can provide a prescription for additional pain medication.   Day 1    Time  Name of Medication Number of pills taken  Amount of Acetaminophen  Pain Level   Comments  AM PM       AM PM       AM PM       AM PM       AM PM       AM PM       AM PM       AM PM       Total Daily amount of Acetaminophen Do not take more than  3,000 mg per day      Day 2    Time  Name of Medication Number of pills taken  Amount of Acetaminophen  Pain Level   Comments  AM PM       AM PM       AM PM       AM PM       AM PM       AM PM       AM  PM       AM PM       Total Daily amount of Acetaminophen Do not take more than  3,000 mg per day      Day 3    Time  Name of Medication Number of pills taken  Amount of Acetaminophen  Pain Level   Comments  AM PM       AM PM       AM PM       AM PM          AM PM       AM PM       AM PM       AM PM       Total Daily amount of Acetaminophen Do not take more than  3,000 mg per day      Day 4    Time  Name of Medication Number of pills taken  Amount of Acetaminophen  Pain Level   Comments  AM PM       AM PM       AM PM       AM PM       AM PM       AM PM       AM PM       AM PM       Total Daily amount of Acetaminophen Do not take more than  3,000 mg per day      Day 5    Time  Name of Medication Number of pills taken  Amount of Acetaminophen  Pain Level   Comments  AM PM       AM PM       AM PM       AM PM       AM PM       AM PM       AM PM       AM PM       Total Daily amount of Acetaminophen Do not take more than    3,000 mg per day       Day 6    Time  Name of Medication Number of pills taken  Amount of Acetaminophen  Pain Level  Comments  AM PM       AM PM       AM PM       AM PM       AM PM       AM PM       AM PM       AM PM       Total Daily amount of Acetaminophen Do not take more than  3,000 mg per day      Day 7    Time  Name of Medication Number of pills taken  Amount of Acetaminophen  Pain Level   Comments  AM PM       AM PM       AM PM       AM PM       AM PM       AM PM       AM PM       AM PM       Total Daily amount of Acetaminophen Do not take more than  3,000 mg per day        For additional information about how and where to safely dispose of unused opioid medications - https://www.morepowerfulnc.org  Disclaimer: This document contains information and/or instructional materials adapted from [facility] for the typical patient with your condition. It does not replace medical advice  from your health care provider because your experience may differ from that of the typical patient. Talk to your health care provider if you have any questions about this document, your condition or your treatment plan. Adapted from [facility]  

## 2021-02-21 ENCOUNTER — Encounter (HOSPITAL_COMMUNITY): Payer: Self-pay | Admitting: Surgery

## 2021-02-22 LAB — SURGICAL PATHOLOGY

## 2021-02-22 NOTE — Anesthesia Postprocedure Evaluation (Signed)
Anesthesia Post Note  Patient: Victoria Wu  Procedure(s) Performed: APPENDECTOMY LAPAROSCOPIC     Patient location during evaluation: PACU Anesthesia Type: General Level of consciousness: awake Pain management: pain level controlled Vital Signs Assessment: post-procedure vital signs reviewed and stable Respiratory status: spontaneous breathing, nonlabored ventilation, respiratory function stable and patient connected to nasal cannula oxygen Cardiovascular status: blood pressure returned to baseline and stable Postop Assessment: no apparent nausea or vomiting Anesthetic complications: no   No notable events documented.  Last Vitals:  Vitals:   02/20/21 1949 02/20/21 1953  BP: 91/67 119/68  Pulse: 80   Resp:    Temp: 36.6 C   SpO2: 100%     Last Pain:  Vitals:   02/20/21 1949  TempSrc:   PainSc: 4                  Sven Pinheiro P Moksha Dorgan

## 2021-02-22 NOTE — Anesthesia Postprocedure Evaluation (Signed)
Anesthesia Post Note  Patient: Victoria Wu  Procedure(s) Performed: APPENDECTOMY LAPAROSCOPIC     Patient location during evaluation: PACU Anesthesia Type: General Level of consciousness: awake Pain management: pain level controlled Vital Signs Assessment: post-procedure vital signs reviewed and stable Respiratory status: spontaneous breathing, nonlabored ventilation, respiratory function stable and patient connected to nasal cannula oxygen Cardiovascular status: blood pressure returned to baseline and stable Postop Assessment: no apparent nausea or vomiting Anesthetic complications: no   No notable events documented.  Last Vitals:  Vitals:   02/20/21 1949 02/20/21 1953  BP: 91/67 119/68  Pulse: 80   Resp:    Temp: 36.6 C   SpO2: 100%     Last Pain:  Vitals:   02/20/21 1949  TempSrc:   PainSc: 4                  Jaileigh Weimer P Zymere Patlan

## 2021-04-09 DIAGNOSIS — F341 Dysthymic disorder: Secondary | ICD-10-CM | POA: Diagnosis not present

## 2021-04-16 DIAGNOSIS — R413 Other amnesia: Secondary | ICD-10-CM | POA: Diagnosis not present

## 2021-04-17 ENCOUNTER — Other Ambulatory Visit: Payer: Self-pay | Admitting: Family Medicine

## 2021-04-17 DIAGNOSIS — R413 Other amnesia: Secondary | ICD-10-CM

## 2021-05-08 DIAGNOSIS — R413 Other amnesia: Secondary | ICD-10-CM | POA: Diagnosis not present

## 2021-05-08 DIAGNOSIS — D649 Anemia, unspecified: Secondary | ICD-10-CM | POA: Diagnosis not present

## 2021-05-10 DIAGNOSIS — Z01419 Encounter for gynecological examination (general) (routine) without abnormal findings: Secondary | ICD-10-CM | POA: Diagnosis not present

## 2021-05-10 DIAGNOSIS — Z6824 Body mass index (BMI) 24.0-24.9, adult: Secondary | ICD-10-CM | POA: Diagnosis not present

## 2021-06-03 DIAGNOSIS — F411 Generalized anxiety disorder: Secondary | ICD-10-CM | POA: Diagnosis not present

## 2021-07-02 DIAGNOSIS — F341 Dysthymic disorder: Secondary | ICD-10-CM | POA: Diagnosis not present

## 2021-07-08 DIAGNOSIS — F329 Major depressive disorder, single episode, unspecified: Secondary | ICD-10-CM | POA: Diagnosis not present

## 2021-08-06 DIAGNOSIS — F411 Generalized anxiety disorder: Secondary | ICD-10-CM | POA: Diagnosis not present

## 2021-08-19 DIAGNOSIS — R413 Other amnesia: Secondary | ICD-10-CM | POA: Diagnosis not present

## 2021-08-19 DIAGNOSIS — Z1159 Encounter for screening for other viral diseases: Secondary | ICD-10-CM | POA: Diagnosis not present

## 2021-08-19 DIAGNOSIS — F324 Major depressive disorder, single episode, in partial remission: Secondary | ICD-10-CM | POA: Diagnosis not present

## 2021-08-19 DIAGNOSIS — R748 Abnormal levels of other serum enzymes: Secondary | ICD-10-CM | POA: Diagnosis not present

## 2021-08-19 DIAGNOSIS — D509 Iron deficiency anemia, unspecified: Secondary | ICD-10-CM | POA: Diagnosis not present

## 2021-09-09 DIAGNOSIS — M7711 Lateral epicondylitis, right elbow: Secondary | ICD-10-CM | POA: Diagnosis not present

## 2021-09-09 DIAGNOSIS — M9901 Segmental and somatic dysfunction of cervical region: Secondary | ICD-10-CM | POA: Diagnosis not present

## 2021-09-09 DIAGNOSIS — M9903 Segmental and somatic dysfunction of lumbar region: Secondary | ICD-10-CM | POA: Diagnosis not present

## 2021-09-09 DIAGNOSIS — M9902 Segmental and somatic dysfunction of thoracic region: Secondary | ICD-10-CM | POA: Diagnosis not present

## 2021-10-01 DIAGNOSIS — F341 Dysthymic disorder: Secondary | ICD-10-CM | POA: Diagnosis not present

## 2021-10-29 DIAGNOSIS — M9902 Segmental and somatic dysfunction of thoracic region: Secondary | ICD-10-CM | POA: Diagnosis not present

## 2021-10-29 DIAGNOSIS — M9901 Segmental and somatic dysfunction of cervical region: Secondary | ICD-10-CM | POA: Diagnosis not present

## 2021-10-29 DIAGNOSIS — M9903 Segmental and somatic dysfunction of lumbar region: Secondary | ICD-10-CM | POA: Diagnosis not present

## 2021-10-29 DIAGNOSIS — M7711 Lateral epicondylitis, right elbow: Secondary | ICD-10-CM | POA: Diagnosis not present

## 2021-11-05 DIAGNOSIS — F411 Generalized anxiety disorder: Secondary | ICD-10-CM | POA: Diagnosis not present

## 2021-11-29 DIAGNOSIS — M25461 Effusion, right knee: Secondary | ICD-10-CM | POA: Diagnosis not present

## 2021-11-29 DIAGNOSIS — M25561 Pain in right knee: Secondary | ICD-10-CM | POA: Diagnosis not present

## 2021-12-10 DIAGNOSIS — M25561 Pain in right knee: Secondary | ICD-10-CM | POA: Diagnosis not present

## 2021-12-12 DIAGNOSIS — M9902 Segmental and somatic dysfunction of thoracic region: Secondary | ICD-10-CM | POA: Diagnosis not present

## 2021-12-12 DIAGNOSIS — M9903 Segmental and somatic dysfunction of lumbar region: Secondary | ICD-10-CM | POA: Diagnosis not present

## 2021-12-12 DIAGNOSIS — S83411A Sprain of medial collateral ligament of right knee, initial encounter: Secondary | ICD-10-CM | POA: Diagnosis not present

## 2021-12-12 DIAGNOSIS — M9901 Segmental and somatic dysfunction of cervical region: Secondary | ICD-10-CM | POA: Diagnosis not present

## 2021-12-16 DIAGNOSIS — M25561 Pain in right knee: Secondary | ICD-10-CM | POA: Diagnosis not present

## 2021-12-17 DIAGNOSIS — F341 Dysthymic disorder: Secondary | ICD-10-CM | POA: Diagnosis not present

## 2021-12-30 DIAGNOSIS — M25561 Pain in right knee: Secondary | ICD-10-CM | POA: Diagnosis not present

## 2022-01-15 DIAGNOSIS — S83411A Sprain of medial collateral ligament of right knee, initial encounter: Secondary | ICD-10-CM | POA: Diagnosis not present

## 2022-01-15 DIAGNOSIS — M9902 Segmental and somatic dysfunction of thoracic region: Secondary | ICD-10-CM | POA: Diagnosis not present

## 2022-01-15 DIAGNOSIS — M9901 Segmental and somatic dysfunction of cervical region: Secondary | ICD-10-CM | POA: Diagnosis not present

## 2022-01-15 DIAGNOSIS — M9903 Segmental and somatic dysfunction of lumbar region: Secondary | ICD-10-CM | POA: Diagnosis not present

## 2022-02-04 DIAGNOSIS — M9901 Segmental and somatic dysfunction of cervical region: Secondary | ICD-10-CM | POA: Diagnosis not present

## 2022-02-04 DIAGNOSIS — M9902 Segmental and somatic dysfunction of thoracic region: Secondary | ICD-10-CM | POA: Diagnosis not present

## 2022-02-04 DIAGNOSIS — M9903 Segmental and somatic dysfunction of lumbar region: Secondary | ICD-10-CM | POA: Diagnosis not present

## 2022-02-04 DIAGNOSIS — S83411A Sprain of medial collateral ligament of right knee, initial encounter: Secondary | ICD-10-CM | POA: Diagnosis not present

## 2022-02-05 DIAGNOSIS — F341 Dysthymic disorder: Secondary | ICD-10-CM | POA: Diagnosis not present

## 2022-02-25 DIAGNOSIS — M9901 Segmental and somatic dysfunction of cervical region: Secondary | ICD-10-CM | POA: Diagnosis not present

## 2022-02-25 DIAGNOSIS — M9903 Segmental and somatic dysfunction of lumbar region: Secondary | ICD-10-CM | POA: Diagnosis not present

## 2022-02-25 DIAGNOSIS — M9902 Segmental and somatic dysfunction of thoracic region: Secondary | ICD-10-CM | POA: Diagnosis not present

## 2022-02-25 DIAGNOSIS — S83411A Sprain of medial collateral ligament of right knee, initial encounter: Secondary | ICD-10-CM | POA: Diagnosis not present

## 2022-03-14 DIAGNOSIS — D509 Iron deficiency anemia, unspecified: Secondary | ICD-10-CM | POA: Diagnosis not present

## 2022-03-31 IMAGING — CT CT ABD-PELV W/ CM
2 of 4 series · 16 of 46 positions shown, 18 images · IV contrast (Omnipaque)
Comparison: None.

CLINICAL DATA: RLQ abdominal pain (Age >= 14y)

EXAM:
CT ABDOMEN AND PELVIS WITH CONTRAST
TECHNIQUE: Multidetector CT imaging of the abdomen and pelvis was performed
using the standard protocol following bolus administration of
intravenous contrast.
CONTRAST:  80mL OMNIPAQUE IOHEXOL 300 MG/ML  SOLN

[Series 2: axial st · axial · 0.81mm/px · z∈[-482,-57]mm · 13 of 93 slices shown, 15 images]
[im 4/93  soft-tissue]
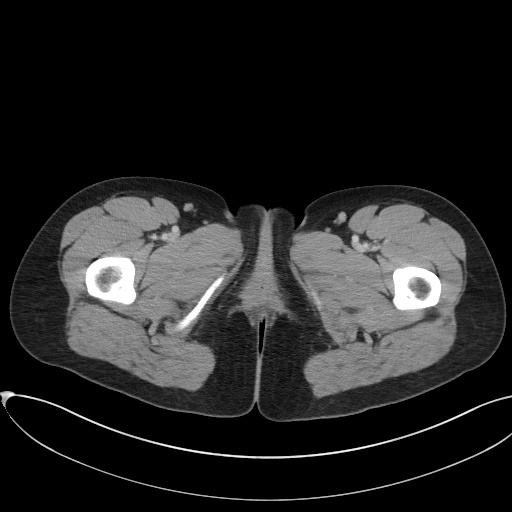
[im 4/93  bone]
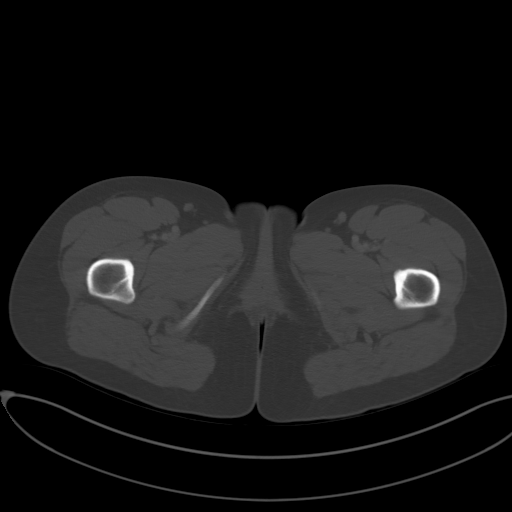
[im 12/93  soft-tissue]
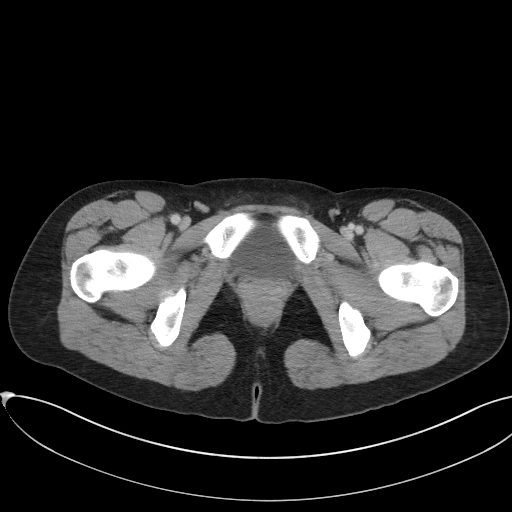
[im 19/93  soft-tissue]
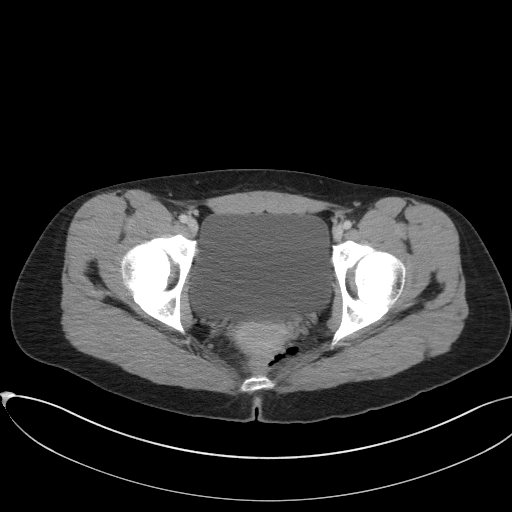
[im 26/93  soft-tissue]
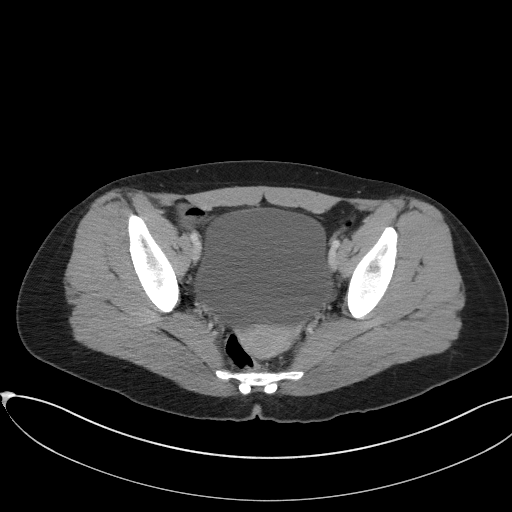
[im 34/93  soft-tissue]
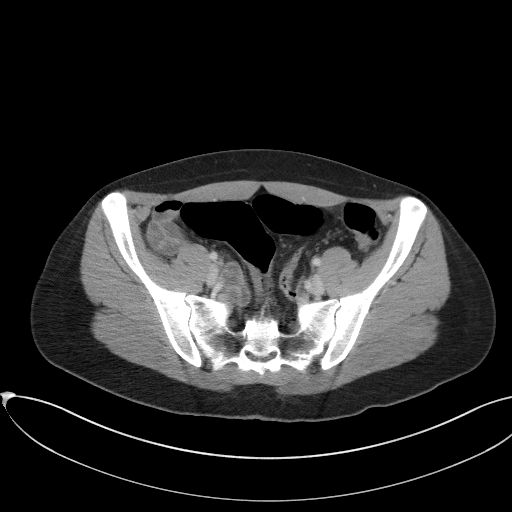
[im 41/93  soft-tissue]
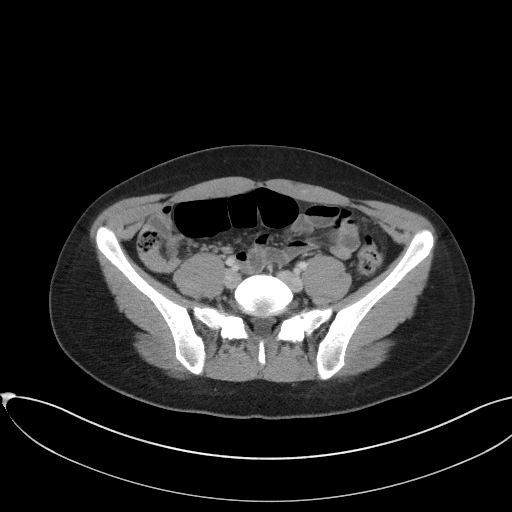
[im 48/93  soft-tissue]
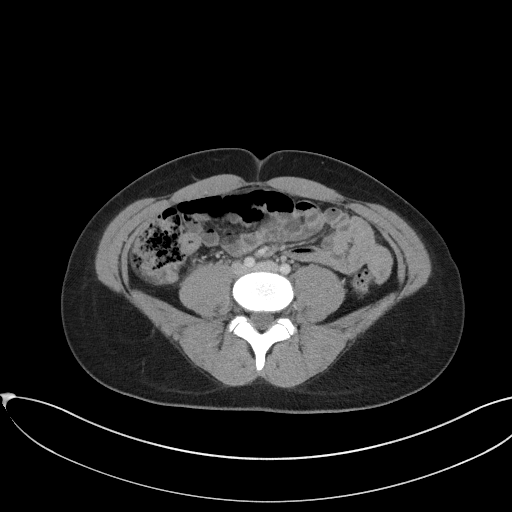
[im 52/93  soft-tissue]
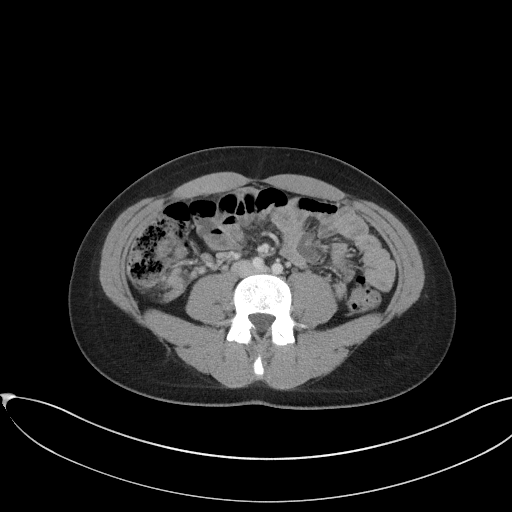
[im 59/93  soft-tissue]
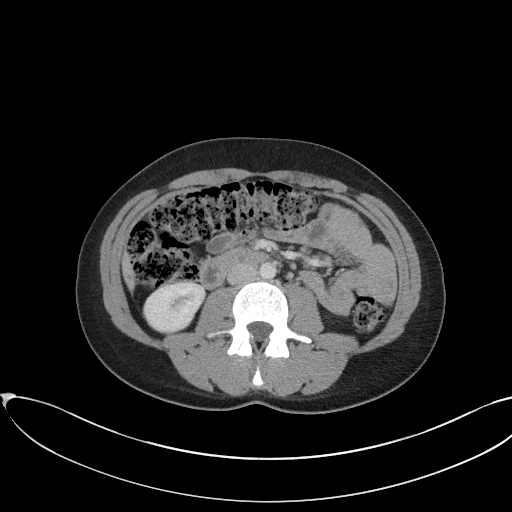
[im 59/93  bone]
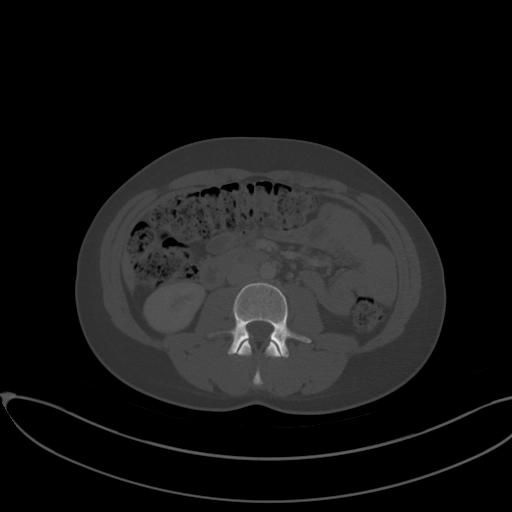
[im 67/93  soft-tissue]
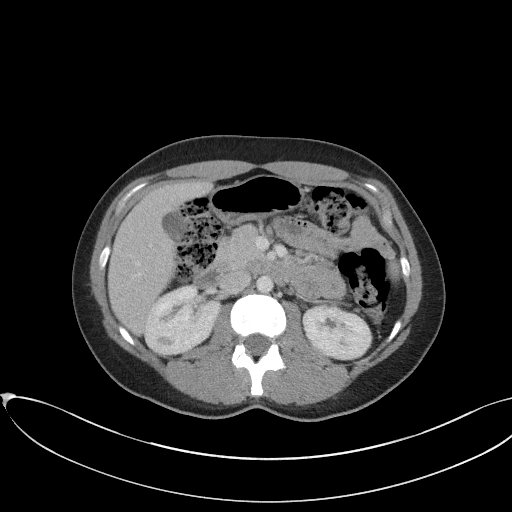
[im 74/93  soft-tissue]
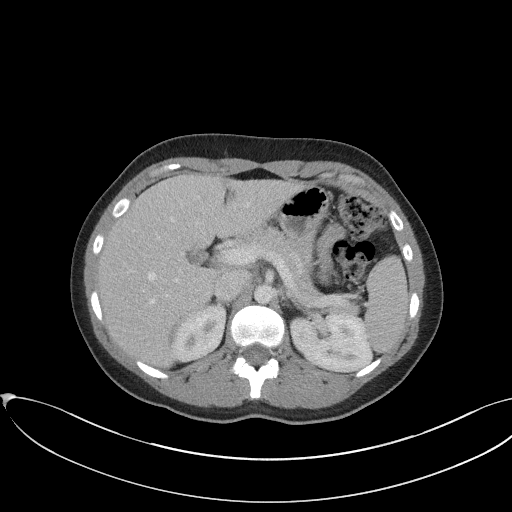
[im 81/93  soft-tissue]
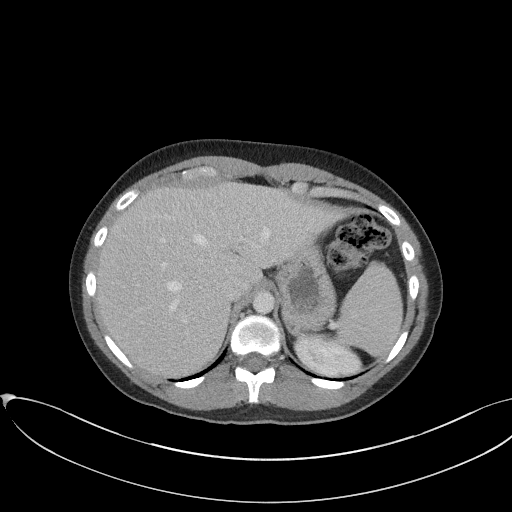
[im 89/93  soft-tissue]
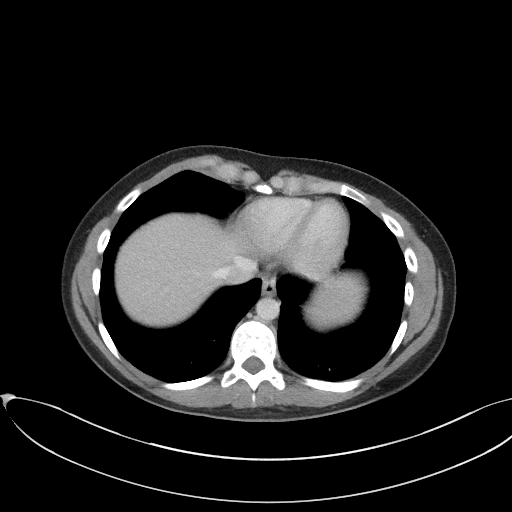

[Series 5: coronal st · coronal · 0.69mm/px · 3 of 85 slices shown]
[im 29/85  soft-tissue]
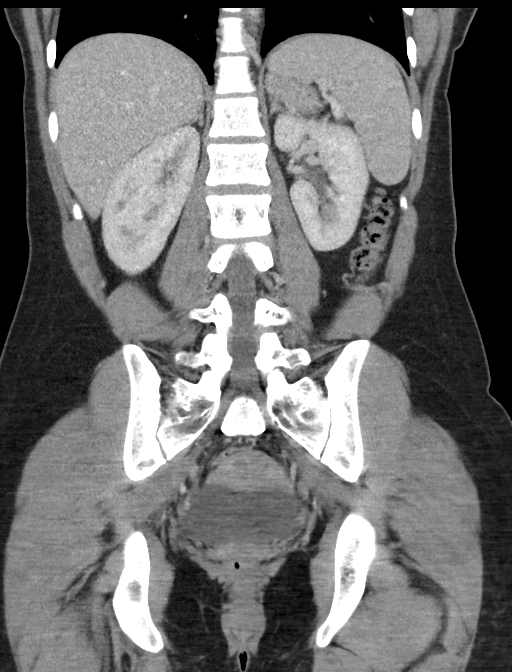
[im 38/85  soft-tissue]
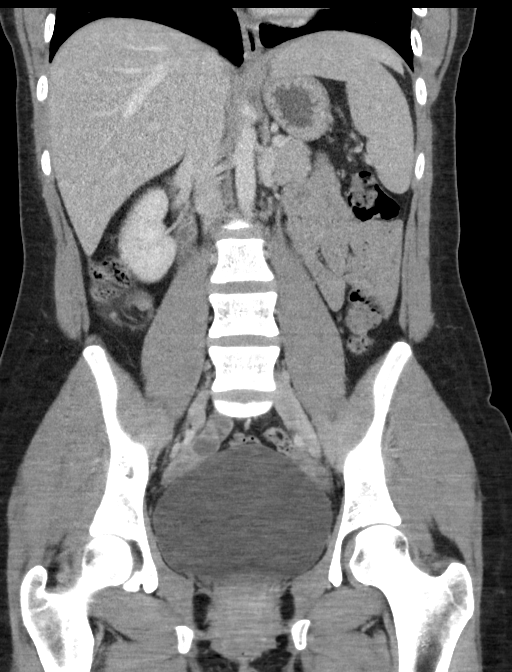
[im 47/85  soft-tissue]
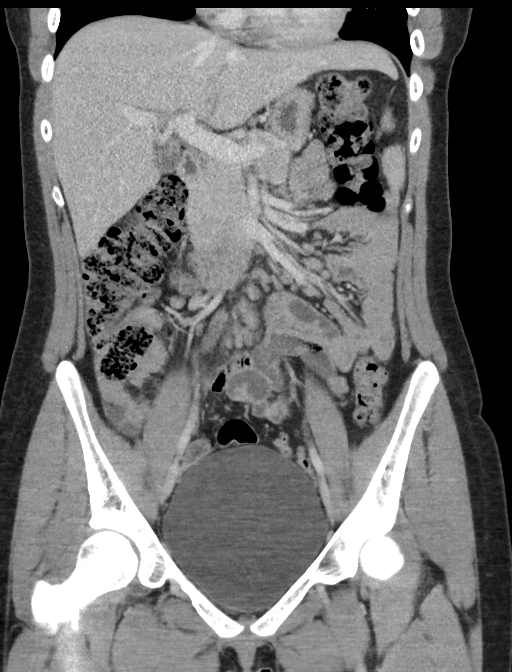

[16 of 46 positions shown; findings below may reference images not displayed]

FINDINGS: Lower chest: No acute abnormality.

Hepatobiliary: No focal liver abnormality is seen. No gallstones,
gallbladder wall thickening, or biliary dilatation.

Pancreas: Unremarkable.

Spleen: Unremarkable.

Adrenals/Urinary Tract: Adrenals, kidneys, and distended bladder are
unremarkable.

Stomach/Bowel: Stomach is within normal limits. Bowel is normal in
caliber. Appendix is mildly dilated with surrounding inflammatory
changes.

Vascular/Lymphatic: No significant vascular abnormality. No enlarged
nodes.

Reproductive: Uterus and bilateral adnexa are unremarkable.

Other: Trace free fluid in the pelvis. Abdominal wall is
unremarkable.

Musculoskeletal: No acute abnormality.
IMPRESSION: Acute appendicitis without complication.

These results were called by telephone at the time of interpretation
on 02/20/2021 at [DATE] to provider ANTWAN KOST , who verbally
acknowledged these results.

## 2022-05-06 DIAGNOSIS — F411 Generalized anxiety disorder: Secondary | ICD-10-CM | POA: Diagnosis not present

## 2022-05-07 DIAGNOSIS — M9901 Segmental and somatic dysfunction of cervical region: Secondary | ICD-10-CM | POA: Diagnosis not present

## 2022-05-07 DIAGNOSIS — M9902 Segmental and somatic dysfunction of thoracic region: Secondary | ICD-10-CM | POA: Diagnosis not present

## 2022-05-07 DIAGNOSIS — S83411A Sprain of medial collateral ligament of right knee, initial encounter: Secondary | ICD-10-CM | POA: Diagnosis not present

## 2022-05-07 DIAGNOSIS — M9903 Segmental and somatic dysfunction of lumbar region: Secondary | ICD-10-CM | POA: Diagnosis not present

## 2022-05-28 DIAGNOSIS — F341 Dysthymic disorder: Secondary | ICD-10-CM | POA: Diagnosis not present

## 2022-06-06 DIAGNOSIS — M9902 Segmental and somatic dysfunction of thoracic region: Secondary | ICD-10-CM | POA: Diagnosis not present

## 2022-06-06 DIAGNOSIS — M9901 Segmental and somatic dysfunction of cervical region: Secondary | ICD-10-CM | POA: Diagnosis not present

## 2022-06-06 DIAGNOSIS — S83411A Sprain of medial collateral ligament of right knee, initial encounter: Secondary | ICD-10-CM | POA: Diagnosis not present

## 2022-06-06 DIAGNOSIS — M9903 Segmental and somatic dysfunction of lumbar region: Secondary | ICD-10-CM | POA: Diagnosis not present

## 2022-06-19 ENCOUNTER — Ambulatory Visit (INDEPENDENT_AMBULATORY_CARE_PROVIDER_SITE_OTHER): Payer: BC Managed Care – PPO | Admitting: Psychiatry

## 2022-06-19 ENCOUNTER — Encounter: Payer: Self-pay | Admitting: Psychiatry

## 2022-06-19 VITALS — BP 120/78 | HR 67 | Ht 70.0 in | Wt 166.0 lb

## 2022-06-19 DIAGNOSIS — M9903 Segmental and somatic dysfunction of lumbar region: Secondary | ICD-10-CM | POA: Diagnosis not present

## 2022-06-19 DIAGNOSIS — F331 Major depressive disorder, recurrent, moderate: Secondary | ICD-10-CM | POA: Diagnosis not present

## 2022-06-19 DIAGNOSIS — F339 Major depressive disorder, recurrent, unspecified: Secondary | ICD-10-CM | POA: Diagnosis not present

## 2022-06-19 DIAGNOSIS — M9902 Segmental and somatic dysfunction of thoracic region: Secondary | ICD-10-CM | POA: Diagnosis not present

## 2022-06-19 DIAGNOSIS — S83411A Sprain of medial collateral ligament of right knee, initial encounter: Secondary | ICD-10-CM | POA: Diagnosis not present

## 2022-06-19 DIAGNOSIS — M9901 Segmental and somatic dysfunction of cervical region: Secondary | ICD-10-CM | POA: Diagnosis not present

## 2022-06-19 DIAGNOSIS — F411 Generalized anxiety disorder: Secondary | ICD-10-CM | POA: Diagnosis not present
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
## 2022-06-20 DIAGNOSIS — Z01419 Encounter for gynecological examination (general) (routine) without abnormal findings: Secondary | ICD-10-CM | POA: Diagnosis not present

## 2022-06-20 DIAGNOSIS — Z6824 Body mass index (BMI) 24.0-24.9, adult: Secondary | ICD-10-CM | POA: Diagnosis not present

## 2022-07-08 DIAGNOSIS — F341 Dysthymic disorder: Secondary | ICD-10-CM | POA: Diagnosis not present

## 2022-07-10 ENCOUNTER — Encounter: Payer: Self-pay | Admitting: Psychiatry

## 2022-07-10 DIAGNOSIS — M9901 Segmental and somatic dysfunction of cervical region: Secondary | ICD-10-CM | POA: Diagnosis not present

## 2022-07-10 DIAGNOSIS — M9903 Segmental and somatic dysfunction of lumbar region: Secondary | ICD-10-CM | POA: Diagnosis not present

## 2022-07-10 DIAGNOSIS — S83411A Sprain of medial collateral ligament of right knee, initial encounter: Secondary | ICD-10-CM | POA: Diagnosis not present

## 2022-07-10 DIAGNOSIS — M9902 Segmental and somatic dysfunction of thoracic region: Secondary | ICD-10-CM | POA: Diagnosis not present

## 2022-07-11 ENCOUNTER — Encounter: Payer: Self-pay | Admitting: Psychiatry

## 2022-07-11 ENCOUNTER — Ambulatory Visit (INDEPENDENT_AMBULATORY_CARE_PROVIDER_SITE_OTHER): Payer: BC Managed Care – PPO | Admitting: Psychiatry

## 2022-07-11 DIAGNOSIS — F411 Generalized anxiety disorder: Secondary | ICD-10-CM

## 2022-07-11 DIAGNOSIS — F339 Major depressive disorder, recurrent, unspecified: Secondary | ICD-10-CM

## 2022-07-11 MED ORDER — VILAZODONE HCL 10 MG PO TABS: ORAL_TABLET | ORAL | 1 refills | Status: DC

## 2022-07-11 MED ORDER — BUPROPION HCL ER (XL) 300 MG PO TB24: 300.0000 mg | ORAL_TABLET | Freq: Every day | ORAL | 1 refills | Status: DC

## 2022-07-11 NOTE — Progress Notes (Unsigned)
Victoria Wu [id number] 1995-08-15 27 y.o.  Subjective:   Patient ID:  Victoria Wu is a 27 y.o. (DOB 1996-02-22) female.  Chief Complaint: No chief complaint on file.   HPI Victoria Wu presents to the office today for follow-up of depression and anxiety.  She reports that her mood continues to be improved compared to the beginning of the year, "but don't feel as good as last year when I felt like it was working." She reports mood is slightly low. Energy and motivation have been good. She reports that she has been staying up later and watching TV and getting less sleep as a result. She reports that her appetite has been good. She reports some possible mild depression. She reports that she has noticed some increase in counting and noticing patterns. She reports that these behaviors are not causing change in function and "more annoying." Denies excessive worry. Denies concentration impairment. Denies SI.   She reports that she forgot to ask her therapist about ADHD symptoms. Administered Adult ADHD rating scale- Part A or B.  She graduated from her masters program.   Past Psychiatric Medication Trials: Wellbutrin XL- "I like it." Helpful for depression. Notices some memory side effects at 300 mg. May have helped some with anxiety. Had some depression with 150 mg.  Prozac- Felt tired   PHQ2-9    Flowsheet Row Office Visit from 07/11/2022 in [facility]  PHQ-2 Total Score 2  PHQ-9 Total Score 4      Flowsheet Row ED to Hosp-Admission (Discharged) from 02/20/2021 in [facility] Admission (Discharged) from 02/01/2021 in [facility]  C-SSRS RISK CATEGORY No Risk No Risk        Review of Systems:  Review of Systems  Musculoskeletal:  Negative for gait problem.  Neurological:  Negative for headaches.  Psychiatric/Behavioral:         Please refer to HPI    Medications: I have reviewed the patient's current  medications.  Current Outpatient Medications  Medication Sig Dispense Refill   buPROPion (WELLBUTRIN XL) 300 MG 24 hr tablet Take 300 mg by mouth daily.     Ferrous Sulfate (IRON PO) Take by mouth.     loratadine (CLARITIN) 10 MG tablet Take 10 mg by mouth daily as needed for allergies.     Magnesium Malate 1250 (141.7 Mg) MG TABS 4 tablets     Multiple Vitamin (MULTIVITAMIN ADULT PO) 2 capsules     norethindrone-ethinyl estradiol-FE (JUNEL FE 1/20) 1-20 MG-MCG tablet Take 1 tablet by mouth daily.     Probiotic Product (PROBIOTIC PO) Take by mouth.     No current facility-administered medications for this visit.    Medication Side Effects: None  Allergies: No Known Allergies  Past Medical History:  Diagnosis Date   Anemia    Enlarged tonsils and adenoids    PONV (postoperative nausea and vomiting)    Thoracic outlet syndrome     Past Medical History, Surgical history, Social history, and Family history were reviewed and updated as appropriate.   Please see review of systems for further details on the patient's review from today.   Objective:   Physical Exam:  There were no vitals taken for this visit.  Physical Exam  Lab Review:     Component Value Date/Time   NA 137 02/20/2021 1229   K 3.6 02/20/2021 1229   CL 104 02/20/2021 1229   CO2 25 02/20/2021 1229   GLUCOSE 90 02/20/2021 1229  BUN 12 02/20/2021 1229   CREATININE 0.76 02/20/2021 1229   CALCIUM 9.0 02/20/2021 1229   PROT 7.1 02/20/2021 1229   ALBUMIN 4.0 02/20/2021 1229   AST 32 02/20/2021 1229   ALT 40 02/20/2021 1229   ALKPHOS 69 02/20/2021 1229   BILITOT 0.2 (L) 02/20/2021 1229   GFRNONAA >60 02/20/2021 1229   GFRAA NOT CALCULATED 04/25/2013 1000       Component Value Date/Time   WBC 10.0 02/20/2021 1229   RBC 4.67 02/20/2021 1229   HGB 13.1 02/20/2021 1229   HCT 38.5 02/20/2021 1229   PLT 243 02/20/2021 1229   MCV 82.4 02/20/2021 1229   MCH 28.1 02/20/2021 1229   MCHC 34.0 02/20/2021 1229    RDW 14.0 02/20/2021 1229   LYMPHSABS 1.7 02/20/2021 1229   MONOABS 1.0 02/20/2021 1229   EOSABS 0.1 02/20/2021 1229   BASOSABS 0.0 02/20/2021 1229    No results found for: "POCLITH", "LITHIUM"   No results found for: "PHENYTOIN", "PHENOBARB", "VALPROATE", "CBMZ"   .res Assessment: Plan:    There are no diagnoses linked to this encounter.   Please see After Visit Summary for patient specific instructions.  No future appointments.  No orders of the defined types were placed in this encounter.   -------------------------------

## 2022-08-15 ENCOUNTER — Ambulatory Visit: Payer: BC Managed Care – PPO | Admitting: Psychiatry

## 2022-09-08 ENCOUNTER — Ambulatory Visit: Payer: BC Managed Care – PPO | Admitting: Psychiatry

## 2022-09-09 ENCOUNTER — Encounter: Payer: Self-pay | Admitting: Psychiatry

## 2022-09-09 ENCOUNTER — Telehealth (INDEPENDENT_AMBULATORY_CARE_PROVIDER_SITE_OTHER): Payer: BC Managed Care – PPO | Admitting: Psychiatry

## 2022-09-09 DIAGNOSIS — F411 Generalized anxiety disorder: Secondary | ICD-10-CM

## 2022-09-09 DIAGNOSIS — F339 Major depressive disorder, recurrent, unspecified: Secondary | ICD-10-CM

## 2022-09-09 MED ORDER — AUVELITY 45-105 MG PO TBCR: EXTENDED_RELEASE_TABLET | ORAL | 1 refills | Status: DC

## 2022-09-09 NOTE — Progress Notes (Signed)
Victoria Wu [id number] 1995/10/24 26 y.o.  Virtual Visit via Video Note  I connected with pt @ on 09/09/22 at 1:15 PM EDT by a video enabled telemedicine application and verified that I am speaking with the correct person using two identifiers.   I discussed the limitations of evaluation and management by telemedicine and the availability of in person appointments. The patient expressed understanding and agreed to proceed.  I discussed the assessment and treatment plan with the patient. The patient was provided an opportunity to ask questions and all were answered. The patient agreed with the plan and demonstrated an understanding of the instructions.   The patient was advised to call back or seek an in-person evaluation if the symptoms worsen or if the condition fails to improve as anticipated.  I provided 40 minutes of non-face-to-face time during this encounter.  The patient was located at home.  The provider was located at [facility].    Subjective:   Patient ID:  Victoria Wu is a 27 y.o. (DOB October 31, 1995) female.  Chief Complaint:  Chief Complaint  Patient presents with   Depression    HPI Victoria Wu presents to the office today for follow-up of depression and anxiety. She reports that she took Viibryd for one week without difficulty and then she started having severe GI side effects. She then stopped Viibryd.   She reports, "I think my anxiety is fine." She reports "struggling to tell if I am more depressed or if I am just in a transition period." She reports feeling, "slightly indifferent." Mood is "melancholy, bleh." She reports, "I still do things I enjoy." Denies low energy or motivation. Exercises often and takes dog hiking. Sees friend when able to. Sleeping well. Denies appetite change. Concentration has been adequate overall. She reports occasionally noticing that she thought she was listening to a conversation and then is no longer able to focus.  Denies SI.   Started job and is adjusting to working 8 hours, 5 days a week. She has had less social interaction with no longer being in school. She does not know many people in the area where she now lives. She works from home several days a week and goes into the office one day a week.   She has started work and this has been an adjustment period coming from school.   She has been checking out groups with shared interests.  Past Psychiatric Medication Trials: Wellbutrin XL- "I like it." Helpful for depression. Notices some memory side effects at 300 mg. May have helped some with anxiety. Had some depression with 150 mg.  Prozac- Felt tired Viibryd- severe GI side effects  GAD-7    Flowsheet Row Office Visit from 07/11/2022 in [facility]  Total GAD-7 Score 8      PHQ2-9    Flowsheet Row Video Visit from 09/09/2022 in [facility] Office Visit from 07/11/2022 in [facility]  PHQ-2 Total Score 3 2  PHQ-9 Total Score 5 4      Flowsheet Row ED to Hosp-Admission (Discharged) from 02/20/2021 in [facility] Admission (Discharged) from 02/01/2021 in [facility]  C-SSRS RISK CATEGORY No Risk No Risk        Review of Systems:  Review of Systems  Musculoskeletal:  Negative for gait problem.  Neurological:  Negative for tremors.  Psychiatric/Behavioral:         Please refer to HPI    Medications: I  have reviewed the patient's current medications.  Current Outpatient Medications  Medication Sig Dispense Refill   buPROPion (WELLBUTRIN XL) 300 MG 24 hr tablet Take 1 tablet (300 mg total) by mouth daily. 90 tablet 1   Dextromethorphan-buPROPion ER (AUVELITY) 45-105 MG TBCR Take 1 tablet daily for 3 days, then increase to 1 tablet twice daily 60 tablet 1   Ferrous Sulfate (IRON PO) Take by mouth.     Magnesium Malate 1250 (141.7 Mg) MG TABS 4 tablets     Multiple Vitamin  (MULTIVITAMIN ADULT PO) 2 capsules     norethindrone-ethinyl estradiol-FE (JUNEL FE 1/20) 1-20 MG-MCG tablet Take 1 tablet by mouth daily.     Probiotic Product (PROBIOTIC PO) Take by mouth.     loratadine (CLARITIN) 10 MG tablet Take 10 mg by mouth daily as needed for allergies. (Patient not taking: Reported on 09/09/2022)     No current facility-administered medications for this visit.    Medication Side Effects: Other: GI side effects with Viibryd  Allergies: No Known Allergies  Past Medical History:  Diagnosis Date   Anemia    Enlarged tonsils and adenoids    PONV (postoperative nausea and vomiting)    Thoracic outlet syndrome     Past Medical History, Surgical history, Social history, and Family history were reviewed and updated as appropriate.   Please see review of systems for further details on the patient's review from today.   Objective:   Physical Exam:  There were no vitals taken for this visit.  Physical Exam Neurological:     Mental Status: She is alert and oriented to person, place, and time.     Cranial Nerves: No dysarthria.  Psychiatric:        Attention and Perception: Attention and perception normal.        Mood and Affect: Mood is not anxious.        Speech: Speech normal.        Behavior: Behavior is cooperative.        Thought Content: Thought content normal. Thought content is not paranoid or delusional. Thought content does not include homicidal or suicidal ideation. Thought content does not include homicidal or suicidal plan.        Cognition and Memory: Cognition and memory normal.        Judgment: Judgment normal.     Comments: Insight intact Mood is mildly depressed     Lab Review:     Component Value Date/Time   NA 137 02/20/2021 1229   K 3.6 02/20/2021 1229   CL 104 02/20/2021 1229   CO2 25 02/20/2021 1229   GLUCOSE 90 02/20/2021 1229   BUN 12 02/20/2021 1229   CREATININE 0.76 02/20/2021 1229   CALCIUM 9.0 02/20/2021 1229   PROT  7.1 02/20/2021 1229   ALBUMIN 4.0 02/20/2021 1229   AST 32 02/20/2021 1229   ALT 40 02/20/2021 1229   ALKPHOS 69 02/20/2021 1229   BILITOT 0.2 (L) 02/20/2021 1229   GFRNONAA >60 02/20/2021 1229   GFRAA NOT CALCULATED 04/25/2013 1000       Component Value Date/Time   WBC 10.0 02/20/2021 1229   RBC 4.67 02/20/2021 1229   HGB 13.1 02/20/2021 1229   HCT 38.5 02/20/2021 1229   PLT 243 02/20/2021 1229   MCV 82.4 02/20/2021 1229   MCH 28.1 02/20/2021 1229   MCHC 34.0 02/20/2021 1229   RDW 14.0 02/20/2021 1229   LYMPHSABS 1.7 02/20/2021 1229   MONOABS 1.0 02/20/2021 1229  EOSABS 0.1 02/20/2021 1229   BASOSABS 0.0 02/20/2021 1229    No results found for: "POCLITH", "LITHIUM"   No results found for: "PHENYTOIN", "PHENOBARB", "VALPROATE", "CBMZ"   .res Assessment: Plan:   Discussed treatment alternatives for depression to include Cymbalta and Auvelity. Discussed potential benefits, risks, and side effects of Auvelity. Will start Auvelity 45-105 mg one tablet daily for 3 days, then increase Auvelity to one tablet twice daily for depression. Advised pt to stop taking Wellbutrin XL once she starts Auvelity since [place] contains Bupropion.  Pt to follow-up with this provider in 6 weeks or sooner if clinically indicated.  Patient advised to contact office with any questions, adverse effects, or acute worsening in signs and symptoms.   Karlyne Greenspan "[name]" was seen today for depression.  Diagnoses and all orders for this visit:  Major depression, recurrent, chronic (HCC) -     Dextromethorphan-buPROPion ER (AUVELITY) 45-105 MG TBCR; Take 1 tablet daily for 3 days, then increase to 1 tablet twice daily  Generalized anxiety disorder     Please see After Visit Summary for patient specific instructions.  No future appointments.  No orders of the defined types were placed in this encounter.   -------------------------------

## 2022-09-15 ENCOUNTER — Telehealth: Payer: Self-pay

## 2022-09-15 NOTE — Telephone Encounter (Signed)
Prior Authorization initiated and approved effective through 09/15/2023 with [employer] for AUVELITY 45-105 MG

## 2022-10-28 ENCOUNTER — Other Ambulatory Visit: Payer: Self-pay | Admitting: Psychiatry

## 2022-10-28 DIAGNOSIS — F339 Major depressive disorder, recurrent, unspecified: Secondary | ICD-10-CM

## 2022-10-29 ENCOUNTER — Telehealth: Payer: Self-pay | Admitting: Psychiatry

## 2022-10-29 DIAGNOSIS — F339 Major depressive disorder, recurrent, unspecified: Secondary | ICD-10-CM

## 2022-10-29 MED ORDER — AUVELITY 45-105 MG PO TBCR: EXTENDED_RELEASE_TABLET | ORAL | 0 refills | Status: DC

## 2022-10-29 NOTE — Telephone Encounter (Signed)
Sent!

## 2022-10-29 NOTE — Telephone Encounter (Signed)
Patient called in for refill on Auvelity 45-105mg . Ph: [phone] Pharmacy [facility] [address] [place]

## 2022-10-31 ENCOUNTER — Ambulatory Visit: Payer: BC Managed Care – PPO | Admitting: Psychiatry

## 2022-11-27 ENCOUNTER — Other Ambulatory Visit: Payer: Self-pay | Admitting: Psychiatry

## 2022-11-27 DIAGNOSIS — F339 Major depressive disorder, recurrent, unspecified: Secondary | ICD-10-CM

## 2022-12-17 ENCOUNTER — Telehealth: Payer: Self-pay | Admitting: Psychiatry

## 2022-12-17 DIAGNOSIS — F339 Major depressive disorder, recurrent, unspecified: Secondary | ICD-10-CM

## 2022-12-17 NOTE — Telephone Encounter (Signed)
Pt called and said  that her copay is very expensive. She had talked to [place] about coming every six months. She wants to know is that ok . She will make  an appt in December as long as she can get  her medicine. Please let her know if that is ok. Call her at [phone]

## 2022-12-18 MED ORDER — AUVELITY 45-105 MG PO TBCR: EXTENDED_RELEASE_TABLET | ORAL | 1 refills | Status: AC

## 2022-12-18 NOTE — Telephone Encounter (Signed)
Please see message and advise 

## 2022-12-18 NOTE — Telephone Encounter (Signed)
Patient notified

## 2022-12-18 NOTE — Telephone Encounter (Signed)
Please let her know that is fine to follow-up every 6 months and that a refill has been sent to [facility].

## 2023-01-07 ENCOUNTER — Encounter: Payer: Self-pay | Admitting: Psychiatry

## 2023-03-18 ENCOUNTER — Other Ambulatory Visit: Payer: Self-pay | Admitting: Medical Genetics

## 2023-09-25 ENCOUNTER — Other Ambulatory Visit (HOSPITAL_COMMUNITY)
Admission: RE | Admit: 2023-09-25 | Discharge: 2023-09-25 | Disposition: A | Discharge status: Home / Self Care | Payer: Self-pay | Source: Ambulatory Visit | Attending: Medical Genetics | Admitting: Medical Genetics

## 2023-10-06 LAB — GENECONNECT MOLECULAR SCREEN: Genetic Analysis Overall Interpretation: NEGATIVE
